# Patient Record
Sex: Female | Born: 1935 | Race: Black or African American | Hispanic: No | Marital: Single | Smoking: Former smoker
Health system: Southern US, Community
[De-identification: ages and names within clinical notes are randomized; demographics above are authoritative.]

## PROBLEM LIST (undated history)

## (undated) DIAGNOSIS — E039 Hypothyroidism, unspecified: Secondary | ICD-10-CM

## (undated) DIAGNOSIS — J189 Pneumonia, unspecified organism: Secondary | ICD-10-CM

## (undated) DIAGNOSIS — IMO0001 Reserved for inherently not codable concepts without codable children: Secondary | ICD-10-CM

## (undated) DIAGNOSIS — M5136 Other intervertebral disc degeneration, lumbar region: Secondary | ICD-10-CM

## (undated) DIAGNOSIS — I509 Heart failure, unspecified: Secondary | ICD-10-CM

## (undated) DIAGNOSIS — R112 Nausea with vomiting, unspecified: Secondary | ICD-10-CM

## (undated) DIAGNOSIS — I499 Cardiac arrhythmia, unspecified: Secondary | ICD-10-CM

## (undated) DIAGNOSIS — D649 Anemia, unspecified: Secondary | ICD-10-CM

## (undated) DIAGNOSIS — I10 Essential (primary) hypertension: Secondary | ICD-10-CM

## (undated) DIAGNOSIS — I639 Cerebral infarction, unspecified: Secondary | ICD-10-CM

## (undated) DIAGNOSIS — Z9889 Other specified postprocedural states: Secondary | ICD-10-CM

## (undated) DIAGNOSIS — E7800 Pure hypercholesterolemia, unspecified: Secondary | ICD-10-CM

## (undated) DIAGNOSIS — M51369 Other intervertebral disc degeneration, lumbar region without mention of lumbar back pain or lower extremity pain: Secondary | ICD-10-CM

## (undated) DIAGNOSIS — J449 Chronic obstructive pulmonary disease, unspecified: Secondary | ICD-10-CM

## (undated) DIAGNOSIS — K219 Gastro-esophageal reflux disease without esophagitis: Secondary | ICD-10-CM

## (undated) DIAGNOSIS — R739 Hyperglycemia, unspecified: Secondary | ICD-10-CM

## (undated) DIAGNOSIS — C801 Malignant (primary) neoplasm, unspecified: Secondary | ICD-10-CM

## (undated) HISTORY — PX: EYE SURGERY: SHX253

## (undated) HISTORY — PX: CARDIAC CATHETERIZATION: SHX172

## (undated) HISTORY — PX: STOMACH SURGERY: SHX791

## (undated) HISTORY — DX: Anemia, unspecified: D64.9

## (undated) HISTORY — PX: ABDOMINAL HYSTERECTOMY: SHX81

## (undated) HISTORY — DX: Chronic obstructive pulmonary disease, unspecified: J44.9

---
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
## 1981-12-04 HISTORY — PX: THYROIDECTOMY: SHX17

## 2013-12-04 DIAGNOSIS — J189 Pneumonia, unspecified organism: Secondary | ICD-10-CM

## 2013-12-04 HISTORY — DX: Pneumonia, unspecified organism: J18.9

## 2015-05-04 ENCOUNTER — Other Ambulatory Visit: Payer: Self-pay | Admitting: Family Medicine

## 2015-05-04 DIAGNOSIS — Z0000 Encounter for general adult medical examination without abnormal findings: Secondary | ICD-10-CM

## 2015-05-04 DIAGNOSIS — H53133 Sudden visual loss, bilateral: Secondary | ICD-10-CM

## 2015-05-05 ENCOUNTER — Other Ambulatory Visit: Payer: Self-pay

## 2015-05-05 ENCOUNTER — Ambulatory Visit
Admission: RE | Admit: 2015-05-05 | Discharge: 2015-05-05 | Disposition: A | Discharge status: Home / Self Care | Payer: No Typology Code available for payment source | Source: Ambulatory Visit | Attending: Family Medicine | Admitting: Family Medicine

## 2015-05-05 DIAGNOSIS — H53133 Sudden visual loss, bilateral: Secondary | ICD-10-CM

## 2015-05-05 DIAGNOSIS — Z0000 Encounter for general adult medical examination without abnormal findings: Secondary | ICD-10-CM

## 2015-05-06 ENCOUNTER — Other Ambulatory Visit: Payer: Self-pay

## 2015-05-07 ENCOUNTER — Ambulatory Visit (HOSPITAL_COMMUNITY)
Admission: RE | Admit: 2015-05-07 | Discharge: 2015-05-07 | Disposition: A | Discharge status: Home / Self Care | Payer: Medicare (Managed Care) | Source: Ambulatory Visit | Attending: Internal Medicine | Admitting: Internal Medicine

## 2015-05-07 ENCOUNTER — Encounter (HOSPITAL_COMMUNITY)
Admission: RE | Disposition: A | Discharge status: Home / Self Care | Payer: Medicaid Other | Source: Ambulatory Visit | Attending: Internal Medicine

## 2015-05-07 ENCOUNTER — Other Ambulatory Visit: Payer: Self-pay | Admitting: Internal Medicine

## 2015-05-07 DIAGNOSIS — Z5309 Procedure and treatment not carried out because of other contraindication: Secondary | ICD-10-CM | POA: Diagnosis not present

## 2015-05-07 DIAGNOSIS — G453 Amaurosis fugax: Secondary | ICD-10-CM

## 2015-05-07 SURGERY — CANCELLED PROCEDURE

## 2015-05-07 NOTE — Progress Notes (Signed)
Pt's TEE not performed today after Dr. Harrington Challenger consulted with pt's eye physician and medical physician. Pt aware of cancellation and aware that she has a new appointment for an Echocardiogram on 6/13 at 1300. PACE aware of appointment as well and aware that appointment today was cancelled. Linward Foster

## 2015-05-07 NOTE — H&P (Signed)
  Patient referred by Dr Bradd Burner for TEE  Episode of amaurosis fugax Was not certain of details  I have contacted [facility]  And spoke with Dr Omelia Blackwater He relays history that she had a short episode of visual loss in 1 eye  Transient   He would recomm an transthoracic echo to rule out myxoma.    Will cancel TEE  Work to shed TTE.  Dorris Carnes

## 2015-05-17 ENCOUNTER — Encounter (HOSPITAL_COMMUNITY): Admit: 2015-05-17 | Payer: Medicaid Other

## 2015-05-17 ENCOUNTER — Other Ambulatory Visit: Payer: Self-pay | Admitting: Internal Medicine

## 2015-05-17 DIAGNOSIS — I509 Heart failure, unspecified: Secondary | ICD-10-CM

## 2015-05-18 ENCOUNTER — Telehealth: Payer: Self-pay

## 2015-05-18 NOTE — Telephone Encounter (Signed)
   This pt should be sched for Transthoracic echo. Not sure if done    Patient was scheduled for an echo at the hospital on 6/13 and was never arrived for the test. Attempted to call patient no answer. .  Not able to leave a message. Will try again tomorrow.

## 2015-05-19 NOTE — Telephone Encounter (Signed)
Attempted to call patient; no answer.  Closing encounter

## 2015-05-25 ENCOUNTER — Telehealth: Payer: Self-pay

## 2015-05-25 NOTE — Telephone Encounter (Signed)
Patient is refusing to have an echo. Tried to talk to patient about the echo. Patient kept repeating "not again". Will forward to Dr. Harrington Challenger, so she is aware.

## 2015-05-25 NOTE — Telephone Encounter (Signed)
-----   Message from Fay Records, MD sent at 05/25/2015  2:43 PM EDT ----- Pt needs to be set up for echo ----- Message -----    From: Jackolyn Confer, MD    Sent: 05/14/2015   9:54 AM      To: Fay Records, MD  Lindsay Park,  Lindsay Park was sent to me for consideration of a temporal artery biopsy to evaluate for temporal arteritis.  I am waiting for her cardiac workup to be complete before scheduling the surgery.  Please let me know what you find on the ECHO.  Given her normal sedimentation rate and one episode of visual change without significant headache, the chances of her having temporal arteritis are low.  Thanks and hope you and your family are doing well.  Jackolyn Confer

## 2015-06-10 ENCOUNTER — Other Ambulatory Visit: Payer: Self-pay

## 2015-06-10 ENCOUNTER — Emergency Department (HOSPITAL_COMMUNITY): Payer: Medicare (Managed Care)

## 2015-06-10 ENCOUNTER — Encounter (HOSPITAL_COMMUNITY): Payer: Self-pay | Admitting: *Deleted

## 2015-06-10 ENCOUNTER — Emergency Department (HOSPITAL_COMMUNITY)
Admission: EM | Admit: 2015-06-10 | Discharge: 2015-06-10 | Disposition: A | Discharge status: Home / Self Care | Payer: Medicare (Managed Care) | Attending: Emergency Medicine | Admitting: Emergency Medicine

## 2015-06-10 ENCOUNTER — Ambulatory Visit (HOSPITAL_COMMUNITY)
Admission: RE | Admit: 2015-06-10 | Discharge: 2015-06-10 | Disposition: A | Discharge status: Home / Self Care | Payer: Medicare (Managed Care) | Source: Ambulatory Visit | Attending: Internal Medicine | Admitting: Internal Medicine

## 2015-06-10 DIAGNOSIS — R52 Pain, unspecified: Secondary | ICD-10-CM

## 2015-06-10 DIAGNOSIS — I7123 Aneurysm of the descending thoracic aorta, without rupture: Secondary | ICD-10-CM

## 2015-06-10 DIAGNOSIS — I313 Pericardial effusion (noninflammatory): Secondary | ICD-10-CM | POA: Diagnosis not present

## 2015-06-10 DIAGNOSIS — I700 Atherosclerosis of aorta: Secondary | ICD-10-CM | POA: Diagnosis not present

## 2015-06-10 DIAGNOSIS — I240 Acute coronary thrombosis not resulting in myocardial infarction: Secondary | ICD-10-CM | POA: Insufficient documentation

## 2015-06-10 DIAGNOSIS — Z859 Personal history of malignant neoplasm, unspecified: Secondary | ICD-10-CM | POA: Diagnosis not present

## 2015-06-10 DIAGNOSIS — I351 Nonrheumatic aortic (valve) insufficiency: Secondary | ICD-10-CM | POA: Diagnosis not present

## 2015-06-10 DIAGNOSIS — I714 Abdominal aortic aneurysm, without rupture: Secondary | ICD-10-CM | POA: Insufficient documentation

## 2015-06-10 DIAGNOSIS — Z8639 Personal history of other endocrine, nutritional and metabolic disease: Secondary | ICD-10-CM | POA: Insufficient documentation

## 2015-06-10 DIAGNOSIS — I10 Essential (primary) hypertension: Secondary | ICD-10-CM | POA: Diagnosis not present

## 2015-06-10 DIAGNOSIS — I509 Heart failure, unspecified: Secondary | ICD-10-CM | POA: Insufficient documentation

## 2015-06-10 DIAGNOSIS — I712 Thoracic aortic aneurysm, without rupture: Secondary | ICD-10-CM | POA: Diagnosis present

## 2015-06-10 DIAGNOSIS — Z8739 Personal history of other diseases of the musculoskeletal system and connective tissue: Secondary | ICD-10-CM | POA: Diagnosis not present

## 2015-06-10 HISTORY — DX: Malignant (primary) neoplasm, unspecified: C80.1

## 2015-06-10 HISTORY — DX: Heart failure, unspecified: I50.9

## 2015-06-10 HISTORY — DX: Other intervertebral disc degeneration, lumbar region without mention of lumbar back pain or lower extremity pain: M51.369

## 2015-06-10 HISTORY — DX: Pure hypercholesterolemia, unspecified: E78.00

## 2015-06-10 HISTORY — DX: Other intervertebral disc degeneration, lumbar region: M51.36

## 2015-06-10 HISTORY — DX: Essential (primary) hypertension: I10

## 2015-06-10 LAB — PROTIME-INR
INR: 1.17 (ref 0.00–1.49)
PROTHROMBIN TIME: 15.1 s (ref 11.6–15.2)

## 2015-06-10 LAB — CBC WITH DIFFERENTIAL/PLATELET
BASOS PCT: 0 % (ref 0–1)
Basophils Absolute: 0 10*3/uL (ref 0.0–0.1)
EOS PCT: 1 % (ref 0–5)
Eosinophils Absolute: 0.1 10*3/uL (ref 0.0–0.7)
HCT: 37.6 % (ref 36.0–46.0)
Hemoglobin: 12.5 g/dL (ref 12.0–15.0)
LYMPHS PCT: 12 % (ref 12–46)
Lymphs Abs: 1 10*3/uL (ref 0.7–4.0)
MCH: 26.8 pg (ref 26.0–34.0)
MCHC: 33.2 g/dL (ref 30.0–36.0)
MCV: 80.7 fL (ref 78.0–100.0)
Monocytes Absolute: 1.1 10*3/uL — ABNORMAL HIGH (ref 0.1–1.0)
Monocytes Relative: 13 % — ABNORMAL HIGH (ref 3–12)
Neutro Abs: 6.2 10*3/uL (ref 1.7–7.7)
Neutrophils Relative %: 74 % (ref 43–77)
PLATELETS: 449 10*3/uL — AB (ref 150–400)
RBC: 4.66 MIL/uL (ref 3.87–5.11)
RDW: 17.4 % — ABNORMAL HIGH (ref 11.5–15.5)
WBC: 8.4 10*3/uL (ref 4.0–10.5)

## 2015-06-10 LAB — BASIC METABOLIC PANEL
ANION GAP: 10 (ref 5–15)
BUN: 11 mg/dL (ref 6–20)
CO2: 28 mmol/L (ref 22–32)
Calcium: 8.8 mg/dL — ABNORMAL LOW (ref 8.9–10.3)
Chloride: 100 mmol/L — ABNORMAL LOW (ref 101–111)
Creatinine, Ser: 1.11 mg/dL — ABNORMAL HIGH (ref 0.44–1.00)
GFR calc non Af Amer: 44 mL/min — ABNORMAL LOW (ref >60)
GFR, EST AFRICAN AMERICAN: 51 mL/min — AB (ref >60)
GLUCOSE: 108 mg/dL — AB (ref 65–99)
Potassium: 3.4 mmol/L — ABNORMAL LOW (ref 3.5–5.1)
Sodium: 138 mmol/L (ref 135–145)

## 2015-06-10 LAB — I-STAT TROPONIN, ED: TROPONIN I, POC: 0 ng/mL (ref 0.00–0.08)

## 2015-06-10 LAB — TYPE AND SCREEN
ABO/RH(D): O POS
ANTIBODY SCREEN: NEGATIVE

## 2015-06-10 LAB — ABO/RH: ABO/RH(D): O POS

## 2015-06-10 LAB — APTT: aPTT: 40 seconds — ABNORMAL HIGH (ref 24–37)

## 2015-06-10 MED ORDER — IOHEXOL 350 MG/ML SOLN
100.0000 mL | Freq: Once | INTRAVENOUS | Status: AC | PRN
  Administered 2015-06-10: 100 mL via INTRAVENOUS

## 2015-06-10 NOTE — ED Provider Notes (Signed)
Patient was sent to the emergency room for evaluation after being told by her cardiologist that she did have a CT scan is done an echocardiogram performed today. When we first evaluated the patient, there was no note in the electronic record system indicating why she was here. We're able to contact the cardiology service and they reported the patient's echocardiogram was concerning for the possibility of dissection Physical Exam  BP 131/80 mmHg  Pulse 78  Temp(Src) 97.9 F (36.6 C) (Oral)  Resp 15  Ht 5' (1.524 m)  Wt 92 lb (41.731 kg)  BMI 17.97 kg/m2  SpO2 99%  Physical Exam  Constitutional: She appears well-developed and well-nourished. No distress.  HENT:  Head: Normocephalic and atraumatic.  Right Ear: External ear normal.  Left Ear: External ear normal.  Eyes: Conjunctivae are normal. Right eye exhibits no discharge. Left eye exhibits no discharge. No scleral icterus.  Neck: Neck supple. No tracheal deviation present.  Cardiovascular: Normal rate.   Pulmonary/Chest: Effort normal. No stridor. No respiratory distress.  Musculoskeletal: She exhibits no edema.  Neurological: She is alert. Cranial nerve deficit: no gross deficits.  Skin: Skin is warm and dry. No rash noted.  Psychiatric: She has a normal mood and affect.  Nursing note and vitals reviewed.   ED Course  Procedures  MDM Pt denies any complaints in the ED.  The CT scan did not show evidence of dissection.  Aneurysm findings were discussed with Dr Bridgett Larsson.   Outpatient follow up recommended.  No need for emergent treatment.      Dorie Rank, MD 06/11/15 [phone]

## 2015-06-10 NOTE — ED Notes (Signed)
Pt had an echocardiogram done today and was told to bring to ED by Dr. Aundra Dubin for a CT scan. Family not sure why.

## 2015-06-10 NOTE — ED Notes (Signed)
Pt reports having ECHO done today and was told by MD McClean to come to ED for CT scan for further evaluation. Pt reports left sided chest pain that travels through to back x "years"; pt in NAD at this time.  Pt has no complaints at this time, only sob on exertion.

## 2015-06-10 NOTE — ED Provider Notes (Signed)
CSN: [id number]     Arrival date & time 06/10/15  1052 History   First MD Initiated Contact with Patient 06/10/15 1144     Chief Complaint  Patient presents with  . CT      (Consider location/radiation/quality/duration/timing/severity/associated sxs/prior Treatment) The history is provided by the patient and a relative. No language interpreter was used.  Lindsay Park is a 81 year old female with a history of hypertension, hyperlipidemia, thyroid disease, and CHF who presents after having a routine outpatient ECHO just prior to arrival and was told by her cardiologist, Dr. Aundra Dubin, to come to the ED. She states she is unaware of why she had to come but has no complaints. She denies any recent illness, fever, chills , dizziness, headache, chest pain, shortness of breath, cough , abdominal pain, nausea, vomiting, diarrhea , leg swelling.  Past Medical History  Diagnosis Date  . Hypertension   . Hypercholesteremia   . Thyroid disease   . CHF (congestive heart failure)   . Degenerative disc disease, lumbar   . Cancer    Past Surgical History  Procedure Laterality Date  . Abdominal hysterectomy    . Thyroidectomy     No family history on file. History  Substance Use Topics  . Smoking status: Never Smoker   . Smokeless tobacco: Not on file  . Alcohol Use: No   OB History    No data available     Review of Systems  Constitutional: Negative for fever.  Respiratory: Negative for chest tightness and shortness of breath.   Cardiovascular: Negative for chest pain, palpitations and leg swelling.  Gastrointestinal: Negative for abdominal pain.  Musculoskeletal: Negative for back pain.  Neurological: Negative for dizziness, syncope, weakness and light-headedness.  All other systems reviewed and are negative.     Allergies  Review of patient's allergies indicates no known allergies.  Home Medications   Prior to Admission medications   Not on File   BP 134/93 mmHg  Pulse 81   Temp(Src) 97.9 F (36.6 C) (Oral)  Resp 18  Ht 5' (1.524 m)  Wt 92 lb (41.731 kg)  BMI 17.97 kg/m2  SpO2 97% Physical Exam  Constitutional: She is oriented to person, place, and time. She appears well-developed and well-nourished.  HENT:  Head: Normocephalic and atraumatic.  Eyes: Conjunctivae are normal.  Neck: Normal range of motion. Neck supple.  Cardiovascular: Normal rate, regular rhythm and normal heart sounds.   Pulmonary/Chest: Effort normal and breath sounds normal. No respiratory distress. She has no wheezes. She has no rales.  Abdominal: Soft. Normal appearance. She exhibits no distension. There is no tenderness. There is no rebound and no guarding.  Musculoskeletal: Normal range of motion.  Neurological: She is alert and oriented to person, place, and time. She has normal strength. No sensory deficit. GCS eye subscore is 4. GCS verbal subscore is 5. GCS motor subscore is 6.  Skin: Skin is warm and dry.  Nursing note and vitals reviewed.   ED Course  Procedures (including critical care time) Labs Review Labs Reviewed  CBC WITH DIFFERENTIAL/PLATELET - Abnormal; Notable for the following:    RDW 17.4 (*)    Platelets 449 (*)    Monocytes Relative 13 (*)    Monocytes Absolute 1.1 (*)    All other components within normal limits  BASIC METABOLIC PANEL - Abnormal; Notable for the following:    Potassium 3.4 (*)    Chloride 100 (*)    Glucose, Bld 108 (*)  Creatinine, Ser 1.11 (*)    Calcium 8.8 (*)    GFR calc non Af Amer 44 (*)    GFR calc Af Amer 51 (*)    All other components within normal limits  APTT - Abnormal; Notable for the following:    aPTT 40 (*)    All other components within normal limits  PROTIME-INR  I-STAT TROPOININ, ED  TYPE AND SCREEN  ABO/RH    Imaging Review Ct Angio Chest Aorta W/cm &/or Wo/cm  06/10/2015   CLINICAL DATA:  Left chest pain radiating to back for years. Shortness breath on exertion.  EXAM: CT ANGIOGRAPHY CHEST, ABDOMEN AND  PELVIS  TECHNIQUE: Multidetector CT imaging through the chest, abdomen and pelvis was performed using the standard protocol during bolus administration of intravenous contrast. Multiplanar reconstructed images and MIPs were obtained and reviewed to evaluate the vascular anatomy.  CONTRAST:  136mL OMNIPAQUE IOHEXOL 350 MG/ML SOLN  COMPARISON:  None.  FINDINGS: CTA CHEST  The noncontrast scan shows a thoracic aortic aneurysm. No pleural or pericardial effusion. Gas and fluid distend the mid esophagus. No mediastinal hematoma.  CTA via right arm injection. The SVC is patent. There is marked right atrial enlargement. The right ventricle is nondilated. Satisfactory opacification of pulmonary arteries noted, and there is no evidence of pulmonary emboli. Patent superior and inferior pulmonary veins bilaterally.  There is good contrast opacification of the aorta. The ascending thoracic aorta is unremarkable. There is bovine variant brachiocephalic arterial origin anatomy without proximal stenosis. Calcified plaque in the distal arch. There is a large eccentric fusiform aneurysm of the descending thoracic aorta measuring 5.3 cm maximum transverse diameter in the proximal segment, 4.5 cm at the level of the carina, 3.5 cm distal descending, 4.4 cm at the level of the diaphragm. There is eccentric mural thrombus in the proximal aneurysmal segment. No evidence of dissection or aortic stenosis.  No hilar or mediastinal adenopathy. Dependent atelectasis posteriorly in both lungs.  Review of the MIP images confirms the above findings.  CTA ABDOMEN AND PELVIS  Arterial findings:  Aorta: Continuation of fusiform aneurysmal dilatation, 4.4 cm in the supraceliac segment, 2.8 cm juxtarenal, tapering to 17 mm just above the bifurcation. No dissection or stenosis. There is some eccentric nonocclusive mural thrombus in the supraceliac and suprarenal segments.  Celiac axis: Diminutive at its origin, with a focal moderately severe stenosis  at the level of the median arcuate ligament of the diaphragm, ectatic distally.  Superior mesenteric: Widely patent. The common hepatic artery originates from the SMA, an anatomic variant. Distal branching otherwise unremarkable.  Left renal:          Single, patent  Right renal:         Single, patent  Inferior mesenteric: Patent  Left iliac: Ectatic common iliac measure in 15 mm diameter. Internal and external branches are mildly ectatic with some scattered nonocclusive plaque. No dissection.  Right iliac: Ectatic common iliac, 15 mm diameter. Mildly tortuous and ectatic internal and external iliac branches without dissection or stenosis.  Venous findings:     Dedicated venous phase imaging not obtained.  Review of the MIP images confirms the above findings.  Nonvascular findings: Unremarkable arterial phase evaluation of liver, spleen, adrenal glands, kidneys, pancreas, gallbladder. Stomach, small bowel and colon are nondilated. Scattered descending diverticula. Urinary bladder physiologically distended. No free air. No ascites. No adenopathy. Spondylitic changes at the thoracolumbar junction and in the lower lumbar spine. Facet DJD L4-5 and L5-S1, probably accounting for the  grade 1 anterolisthesis L4-5.  IMPRESSION: 1. Fusiform descending thoracic aortic aneurysm extending to the juxtarenal abdominal aorta, as detailed above, 5.3 cm maximum transverse diameter. 2. Negative for acute  PE or aortic Dissection. 3. 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   Electronically Signed   By: Lucrezia Europe M.D.   On: 06/10/2015 14:11   Ct Angio Abd/pel W/ And/or W/o  06/10/2015   CLINICAL DATA:  Left chest pain radiating to back for years. Shortness breath on exertion.  EXAM: CT ANGIOGRAPHY CHEST, ABDOMEN AND PELVIS  TECHNIQUE: Multidetector CT imaging through the chest, abdomen and pelvis was performed using the standard protocol during bolus administration of intravenous  contrast. Multiplanar reconstructed images and MIPs were obtained and reviewed to evaluate the vascular anatomy.  CONTRAST:  156mL OMNIPAQUE IOHEXOL 350 MG/ML SOLN  COMPARISON:  None.  FINDINGS: CTA CHEST  The noncontrast scan shows a thoracic aortic aneurysm. No pleural or pericardial effusion. Gas and fluid distend the mid esophagus. No mediastinal hematoma.  CTA via right arm injection. The SVC is patent. There is marked right atrial enlargement. The right ventricle is nondilated. Satisfactory opacification of pulmonary arteries noted, and there is no evidence of pulmonary emboli. Patent superior and inferior pulmonary veins bilaterally.  There is good contrast opacification of the aorta. The ascending thoracic aorta is unremarkable. There is bovine variant brachiocephalic arterial origin anatomy without proximal stenosis. Calcified plaque in the distal arch. There is a large eccentric fusiform aneurysm of the descending thoracic aorta measuring 5.3 cm maximum transverse diameter in the proximal segment, 4.5 cm at the level of the carina, 3.5 cm distal descending, 4.4 cm at the level of the diaphragm. There is eccentric mural thrombus in the proximal aneurysmal segment. No evidence of dissection or aortic stenosis.  No hilar or mediastinal adenopathy. Dependent atelectasis posteriorly in both lungs.  Review of the MIP images confirms the above findings.  CTA ABDOMEN AND PELVIS  Arterial findings:  Aorta: Continuation of fusiform aneurysmal dilatation, 4.4 cm in the supraceliac segment, 2.8 cm juxtarenal, tapering to 17 mm just above the bifurcation. No dissection or stenosis. There is some eccentric nonocclusive mural thrombus in the supraceliac and suprarenal segments.  Celiac axis: Diminutive at its origin, with a focal moderately severe stenosis at the level of the median arcuate ligament of the diaphragm, ectatic distally.  Superior mesenteric: Widely patent. The common hepatic artery originates from the SMA,  an anatomic variant. Distal branching otherwise unremarkable.  Left renal:          Single, patent  Right renal:         Single, patent  Inferior mesenteric: Patent  Left iliac: Ectatic common iliac measure in 15 mm diameter. Internal and external branches are mildly ectatic with some scattered nonocclusive plaque. No dissection.  Right iliac: Ectatic common iliac, 15 mm diameter. Mildly tortuous and ectatic internal and external iliac branches without dissection or stenosis.  Venous findings:     Dedicated venous phase imaging not obtained.  Review of the MIP images confirms the above findings.  Nonvascular findings: Unremarkable arterial phase evaluation of liver, spleen, adrenal glands, kidneys, pancreas, gallbladder. Stomach, small bowel and colon are nondilated. Scattered descending diverticula. Urinary bladder physiologically distended. No free air. No ascites. No adenopathy. Spondylitic changes at the thoracolumbar junction and in the lower lumbar spine. Facet DJD L4-5 and L5-S1, probably accounting for the grade 1 anterolisthesis L4-5.  IMPRESSION: 1. Fusiform descending thoracic aortic aneurysm extending  to the juxtarenal abdominal aorta, as detailed above, 5.3 cm maximum transverse diameter. 2. Negative for acute  PE or aortic Dissection. 3. 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   Electronically Signed   By: Lucrezia Europe M.D.   On: 06/10/2015 14:11     EKG Interpretation None      MDM   Final diagnoses:  Descending thoracic aortic aneurysm   I spoke to Dr. Aundra Dubin who stated that the patient had a possible descending aortic dissection which was the reason for sending her to the ED.  Echo results have not come through in epic yet since she had it done this morning at 9:30 AM. I spoke to Dr. Hillard Danker regarding this patient who has seen and evaluated the patient himself. Right Arm: 127/80  Left Arm: 137/91   Patient's vitals are stable. Chest CT  angiogram shows descending thoracic aortic aneurysm measuring 5.3 cm.  No PE or aortic dissection. Patient has no complaints of shortness of breath or chest pain. I spoke to Dr. Bridgett Larsson, Vascular surgeon, regarding the patient's aortic aneurysm being 5.3 cm. He stated that he would see her in the office. I discussed the results of lab work and CT of the chest and abdomen pelvis with patient and daughter. They agree to call the office and make an appointment.  I discussed strict return precautions such as chest pain, shortness of breath, or light headedness.     Ottie Glazier, PA-C 06/10/15 1604  Dorie Rank, MD 06/11/15 [phone]

## 2015-06-10 NOTE — Progress Notes (Signed)
  Echocardiogram 2D Echocardiogram has been performed.  Lindsay Park 06/10/2015, 12:15 PM

## 2015-06-10 NOTE — Discharge Instructions (Signed)
Thoracic Aortic Aneurysm   Return for shortness of breath or chest pain. An aneurysm is a bulge in an artery. It happens when the wall of the artery is weakened or damaged. If the aneurysm gets too big, it bursts (ruptures) and severe bleeding occurs. A thoracic aortic aneurysm is an aneurysm that occurs in the first part of the aorta, between the heart and the diaphragm. The aorta is the main artery and supplies blood from the heart to the rest of the body. A thoracic aortic aneurysm can enlarge and rupture or blood can flow between the layers of the wall of the aorta through a tear (aorticdissection). Both of these conditions can cause bleeding inside the body and can be life threatening unless diagnosed and treated promptly. CAUSES  The exact cause of a thoracic aortic aneurysm is often unknown. Some contributing factors are:   A hardening of the arteries caused by the buildup of fat and other substances in the lining of a blood vessel (arteriosclerosis).  Inflammation of the walls of an artery (arteritis).  Connective tissue diseases, such as Marfan syndrome.  Injury or trauma to the aorta.  An infection, such as syphilis or staphylococcus, in the wall of the aorta (infectious aortitis) caused by bacteria. RISK FACTORS  Risk factors that contribute to a thoracic aortic aneurysm may include:  Age older than 45 years.  High blood pressure (hypertension).  Female gender.  Ethnicity (white race).  Obesity.  Family history of aneurysm (first degree relatives only).  Tobacco use. PREVENTION  The following healthy lifestyle habits may help decrease your risk of a thoracic aortic aneurysm:  Quitting smoking. Smoking can raise your blood pressure and cause arteriosclerosis.  Limiting or avoiding alcohol.  Keeping your blood pressure, blood sugar level, and cholesterol levels within normal limits.  Decreasing your salt intake. In some people, too much salt can raise blood pressure  and increase your risk of abdominal aortic aneurysm.  Eating a diet low in saturated fats and cholesterol.  Increasing your fiber intake by including whole grains, vegetables, and fruits in your diet. Eating these foods may help lower blood pressure.  Maintaining a healthy weight.  Staying physically active and exercising regularly. SYMPTOMS  The symptoms of thoracic aortic aneurysm may vary depending on the size and rate of growth of the aneurysm. Most grow slowly and do not have any symptoms. When symptoms do occur, they may include:  Pain (chest, back, sides, or abdomen). The pain may vary in intensity. A sudden onset of severe pain may indicate that the aneurysm has ruptured.  Hoarseness.  Cough.  Shortness of breath.  Swallowing problems.  Nausea or vomiting or both. DIAGNOSIS  Since most unruptured thoracic aortic aneurysms have no symptoms, they are often discovered during diagnostic exams for other conditions. An aneurysm may be found during the following procedures:  Ultrasonography (a one-time screening for thoracic aortic aneurysm by ultrasonography is also recommended for all men aged 78-75 years who have ever smoked).  X-ray exams.  A CT scan.  An MRI.  Angiography or arteriography. TREATMENT  Treatment of a thoracic aortic aneurysm depends on the size of your aneurysm, your age, and risk factors for rupture. Medicine to control blood pressure and pain may be used to manage aneurysms smaller than 2.3 in (6 cm). Regular monitoring for enlargement may be recommended by your health care provider if:  The aneurysm is 1.2-1.5 in (3-4 cm) in size (an annual ultrasonography may be recommended).  The aneurysm is  1.5-1.8 in (4-4.5 cm) in size (an ultrasonography every 6 months may be recommended).  The aneurysm is larger than 1.8 in (4.5 cm) in size (your health care provider may ask that you be examined by a vascular surgeon). If your aneurysm is larger than 2.2 in  (5.5 cm) or if it is enlarging quickly, surgical repair may be recommended. There are two main methods for repair of an aneurysm:   Endovascular repair (a minimally invasive surgery).  Open repair. This method is used if an endovascular repair is not possible. Document Released: 11/20/2005 Document Revised: 09/10/2013 Document Reviewed: 06/02/2013 [facility] Patient Information 2015 [place], [place]. This information is not intended to replace advice given to you by your health care provider. Make sure you discuss any questions you have with your health care provider.

## 2015-06-10 NOTE — ED Notes (Signed)
Per MD obtain BP in both left and right arm:  Right Arm: 127/80 Left Arm: 137/91

## 2015-06-10 NOTE — ED Notes (Signed)
Patient transported to CT 

## 2015-06-10 NOTE — ED Notes (Signed)
Pt had a cardiovascular procedure today.  When the results were read by Dr Marigene Ehlers, he told the pt to come to the ED for a CT.  Pt denies chest pain, but states dyspnea on exertion.

## 2015-06-11 ENCOUNTER — Encounter: Payer: Medicare (Managed Care) | Admitting: Vascular Surgery

## 2015-06-11 ENCOUNTER — Encounter: Payer: Self-pay | Admitting: Vascular Surgery

## 2015-06-17 ENCOUNTER — Encounter: Payer: Self-pay | Admitting: Vascular Surgery

## 2015-06-18 ENCOUNTER — Ambulatory Visit (INDEPENDENT_AMBULATORY_CARE_PROVIDER_SITE_OTHER): Payer: Medicare (Managed Care) | Admitting: Vascular Surgery

## 2015-06-18 ENCOUNTER — Encounter: Payer: Self-pay | Admitting: Vascular Surgery

## 2015-06-18 VITALS — BP 134/88 | HR 91 | Ht 60.0 in | Wt 91.8 lb

## 2015-06-18 DIAGNOSIS — I716 Thoracoabdominal aortic aneurysm, without rupture, unspecified: Secondary | ICD-10-CM | POA: Insufficient documentation

## 2015-06-18 DIAGNOSIS — Q254 Congenital malformation of aorta unspecified: Secondary | ICD-10-CM

## 2015-06-18 DIAGNOSIS — Z01818 Encounter for other preprocedural examination: Secondary | ICD-10-CM | POA: Diagnosis not present

## 2015-06-18 NOTE — Progress Notes (Signed)
Referred by: [facility] ED  Reason for referral: thoracic aneurysm  History of Present Illness  The patient is a 81 y.o. (1929/05/08) female who presents with chief complaint: chest pain.  Patient has  2-3 year history of intermittent chest pain.  The patient was at [facility] getting a echocardiogram.  They thought they saw an aortic dissection and sent the patient to the ED for CTA.  At the ED, she was diagnosed with a thoracic aneurysm.  She was not sx at that time so she was sent to clinic for evaluation.  The patient intermittent chest pain radiating to back.  The patient's risk factors for TAAA included: none.  The patient smoked > 45 years ago.   Past Medical History  Diagnosis Date  . Hypertension   . Hypercholesteremia   . Thyroid disease   . CHF (congestive heart failure)   . Degenerative disc disease, lumbar   . Cancer   . Anemia   . COPD (chronic obstructive pulmonary disease)     Past Surgical History  Procedure Laterality Date  . Abdominal hysterectomy    . Thyroidectomy    . Stomach surgery      History   Social History  . Marital Status: Single    Spouse Name: N/A  . Number of Children: N/A  . Years of Education: N/A   Occupational History  . Not on file.   Social History Main Topics  . Smoking status: Never Smoker   . Smokeless tobacco: Not on file  . Alcohol Use: No  . Drug Use: No  . Sexual Activity: Not on file   Other Topics Concern  . Not on file   Social History Narrative    Family History  Problem Relation Age of Onset  . Heart disease Sister     before age 25  . Deep vein thrombosis Daughter   . COPD Daughter   . Hyperlipidemia Daughter   . Hypertension Daughter   . COPD Son   . Hyperlipidemia Son   . Hypertension Son     Current Outpatient Prescriptions  Medication Sig Dispense Refill  . aspirin 81 MG tablet Take 81 mg by mouth daily.    Marland Kitchen atorvastatin (LIPITOR) 20 MG tablet Take 20 mg by mouth daily.    . Cholecalciferol  (VITAMIN D3) 50000 UNITS CAPS Take 1 capsule by mouth once a week.    . famotidine (PEPCID) 20 MG tablet Take 20 mg by mouth 2 (two) times daily.    Marland Kitchen levothyroxine (SYNTHROID, LEVOTHROID) 100 MCG tablet Take 100 mcg by mouth daily before breakfast.    . losartan (COZAAR) 25 MG tablet Take 25 mg by mouth daily.    . metoprolol succinate (TOPROL-XL) 50 MG 24 hr tablet Take 50 mg by mouth daily. Take with or immediately following a meal.    . traMADol (ULTRAM) 50 MG tablet Take by mouth every 12 (twelve) hours as needed.    . predniSONE (DELTASONE) 20 MG tablet      No current facility-administered medications for this visit.     No Known Allergies    REVIEW OF SYSTEMS:  (Positives checked otherwise negative)  CARDIOVASCULAR:   [x]  chest pain,  [x]  chest pressure,  [x]  palpitations,  [x]  shortness of breath when laying flat,  [x]  shortness of breath with exertion,   [ ]  pain in feet when walking,  [ ]  pain in feet when laying flat, [ ]  history of blood clot in veins (DVT),  [ ]   history of phlebitis,  [ ]  swelling in legs,  [ ]  varicose veins  PULMONARY:   [ ]  productive cough,  [ ]  asthma,  [ ]  wheezing  NEUROLOGIC:   [x]  weakness in arms or legs,  [x]  numbness in arms or legs,  [ ]  difficulty speaking or slurred speech,  [ ]  temporary loss of vision in one eye,  [ ]  dizziness  HEMATOLOGIC:   [ ]  bleeding problems,  [ ]  problems with blood clotting too easily  MUSCULOSKEL:   [ ]  joint pain, [ ]  joint swelling  GASTROINTEST:   [ ]  vomiting blood,  [ ]  blood in stool     GENITOURINARY:   [ ]  burning with urination,  [ ]  blood in urine  PSYCHIATRIC:   [ ]  history of major depression  INTEGUMENTARY:   [ ]  rashes,  [ ]  ulcers  CONSTITUTIONAL:   [x]  fever,  [ ]  chills   For VQI Use Only   PRE-ADM LIVING: Home  AMB STATUS: Ambulatory  CAD Sx: None  PRIOR CHF: Mild  STRESS TEST: [x]  No, [ ]  Normal, [ ]  + ischemia, [ ]  + MI, [ ]  Both   Physical  Examination  Filed Vitals:   06/18/15 1424 06/18/15 1426  BP: 176/103 134/88  Pulse: 91   Height: 5' (1.524 m)   Weight: 91 lb 12.8 oz (41.64 kg)   SpO2: 98%    Body mass index is 17.93 kg/(m^2).  General: A&O x 3, WD, Cachectic, Ill appear   Head: [place]/AT, Temporalis wasting   Ear/Nose/Throat: Hearing grossly intact, nares w/o erythema or drainage, oropharynx w/o Erythema/Exudate, poor dentition  Eyes: PERRL, EOMI, B arcus senilus  Neck: Supple, no nuchal rigidity, no palpable LAD  Pulmonary: Sym exp, good air movt, CTAB, no rales, rhonchi, & wheezing  Cardiac: RRR, Nl S1, S2, no rubs or gallops, +murmur over apex  Vascular: Vessel Right Left  Radial Palpable Palpable  Brachial Palpable Palpable  Carotid Palpable, without bruit Palpable, without bruit  Aorta Not palpable N/A  Femoral Palpable Palpable  Popliteal Not palpable Not palpable  PT Palpable Palpable  DP Palpable Palpable   Gastrointestinal: soft, NTND, no G/R, no HSM, no masses, no CVAT B  Musculoskeletal: M/S 5/5 throughout , Extremities without ischemic changes   Neurologic: CN 2-12 intact , Pain and light touch intact in extremities , Motor exam as listed above  Psychiatric: Judgment intact, Mood & affect appropriate for pt's clinical situation  Dermatologic: See M/S exam for extremity exam, no rashes otherwise noted  Lymph : No Cervical, Axillary, or Inguinal lymphadenopathy    Non-Invasive Vascular Imaging  Echocardiogram (06/10/15): - Left ventricle: The cavity size was normal. Wall thickness was normal. Doppler parameters are consistent with abnormal left ventricular relaxation (grade 1 diastolic dysfunction). - Aortic valve: There was trivial regurgitation. - Aorta: There is severe atherosclerosis in the aortic arch and descending thoracic aorta. The suprarenal abdominal aorta is mildly aneurysmal (diameter 36 mm), with mural thrombus, and heavy spontaneous echo contrast. No intimal  flap is seen. - Atrial septum: The septum bowed from right to left, consistent with increased right atrial pressure. - Tricuspid valve: Moderate, holosystolicprolapse. There was moderate regurgitation. - Pericardium, extracardiac: A small to moderate pericardial effusion was identified circumferential to the heart.  B carotid duplex (05/05/15): 1. Mild bilateral carotid bifurcation plaque, with less than 50% diameter stenosis. The exam does not exclude plaque ulceration or embolization. Continued surveillance recommended.   Laboratory: CBC:  Component Value Date/Time   WBC 8.4 06/10/2015 1234   RBC 4.66 06/10/2015 1234   HGB 12.5 06/10/2015 1234   HCT 37.6 06/10/2015 1234   PLT 449* 06/10/2015 1234   MCV 80.7 06/10/2015 1234   MCH 26.8 06/10/2015 1234   MCHC 33.2 06/10/2015 1234   RDW 17.4* 06/10/2015 1234   LYMPHSABS 1.0 06/10/2015 1234   MONOABS 1.1* 06/10/2015 1234   EOSABS 0.1 06/10/2015 1234   BASOSABS 0.0 06/10/2015 1234    BMP:    Component Value Date/Time   NA 138 06/10/2015 1234   K 3.4* 06/10/2015 1234   CL 100* 06/10/2015 1234   CO2 28 06/10/2015 1234   GLUCOSE 108* 06/10/2015 1234   BUN 11 06/10/2015 1234   CREATININE 1.11* 06/10/2015 1234   CALCIUM 8.8* 06/10/2015 1234   GFRNONAA 44* 06/10/2015 1234   GFRAA 51* 06/10/2015 1234    Coagulation: Lab Results  Component Value Date   INR 1.17 06/10/2015   No results found for: PTT  Lipids: No results found for: CHOL, TRIG, HDL, CHOLHDL, VLDL, LDLCALC, LDLDIRECT  Radiology:  CTA chest/abd/pelvis (06/10/15) 1. Fusiform descending thoracic aortic aneurysm extending to the juxtarenal abdominal aorta, as detailed above, 5.3 cm maximum transverse diameter. 2. Negative for acute PE or aortic Dissection. 3. 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  Based on my review of this patient's CTA, she has Crawford 1 TAAA.  I think she has a PAU in the  thoracic segment which might account for her sx.     Medical Decision Making  The patient is a 81 y.o. 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  In fact, I have concerns that she might not do well with a endovascular TAAA repair (TEVAR+FEVAR)  Rather, I would favor treating the likely thoracic PAU with thoracic endograft and seeing if this is enough to alleviate her sx.  If she lives long enough, she might need the FEVAR at a later date.  This staged approach has become more common practice in the literature due to the concept of ischemic conditioning of the intercostal arteries.  I will need to do a 3-D reconstruction on the chest CTA to see if there is enough landing zone for the proximal graft.  If not, a L SCA to L CCA transposition or L SCA to L CCA graft maybe needed.   Cardiac evaluation would be essential for risk stratification and optimization.  We discussed proceeding with the cardiac work-up while my [employer] rep was evaluating the CTA for compatibility with his device.  Will plan on her coming back in the next 2-4 weeks after the cardiac evaluation is completed.  I emphasized the importance of maximal medical management including strict control of blood pressure, blood glucose, and lipid levels, antiplatelet agents, obtaining regular exercise, and cessation of smoking.    Thank you for allowing [place] to participate in this patient's care.   Adele Barthel, MD [facility] Office: [phone] Pager: [phone]  06/18/2015, 4:57 PM

## 2015-06-21 ENCOUNTER — Encounter: Payer: Self-pay | Admitting: Cardiology

## 2015-06-21 ENCOUNTER — Encounter: Payer: Self-pay | Admitting: *Deleted

## 2015-06-21 ENCOUNTER — Ambulatory Visit (INDEPENDENT_AMBULATORY_CARE_PROVIDER_SITE_OTHER): Payer: Medicare (Managed Care) | Admitting: Cardiology

## 2015-06-21 VITALS — BP 110/70 | HR 78 | Ht 60.0 in | Wt 91.0 lb

## 2015-06-21 DIAGNOSIS — Z01810 Encounter for preprocedural cardiovascular examination: Secondary | ICD-10-CM

## 2015-06-21 DIAGNOSIS — I5022 Chronic systolic (congestive) heart failure: Secondary | ICD-10-CM

## 2015-06-21 DIAGNOSIS — R0602 Shortness of breath: Secondary | ICD-10-CM

## 2015-06-21 NOTE — Progress Notes (Addendum)
Clinical Summary Lindsay Park is a 81 y.o.female seen today as a new patient for the following medical problems.  1. Preoperative evaluation  - patient is being considered for aortic aneurysm repair - CTA C/A/P with fusiform descending thoracic aortic aneurysm 5.3 cm - echo 06/2015 grade I diastoilc dysfunction, severe atherosclerosis of aortic arch and descending aorta, mural thomrbus in descending aorta, mod TR. LVEF omitted from report but by my review appears to be mildly decreased LVEF 40-45% with septal hypokinesis.    2. Chronic systolic HF - reports diagnosed with CHF 14 years at [facility], reports cath at time - she is on Toprol XL and ARB - recent echo with mild LV systolic dysfunction LVEF 40-45%, septal hypokinesis -  Denies any LE edema, no orthopnea, no PND. Chronic DOE at short distances.   - denies any chest pressure, has had some atypical chest pain over the last few months. Occasional palpitations.  - sedentary lifestyle, SOB with walking up flight of stairs.   3. SOB - smoking x 30 years  Past Medical History  Diagnosis Date  . Hypertension   . Hypercholesteremia   . Thyroid disease   . CHF (congestive heart failure)   . Degenerative disc disease, lumbar   . Cancer   . Anemia   . COPD (chronic obstructive pulmonary disease)      No Known Allergies   Current Outpatient Prescriptions  Medication Sig Dispense Refill  . aspirin 81 MG tablet Take 81 mg by mouth daily.    Marland Kitchen atorvastatin (LIPITOR) 20 MG tablet Take 20 mg by mouth daily.    . Cholecalciferol (VITAMIN D3) 50000 UNITS CAPS Take 1 capsule by mouth once a week.    . famotidine (PEPCID) 20 MG tablet Take 20 mg by mouth 2 (two) times daily.    Marland Kitchen levothyroxine (SYNTHROID, LEVOTHROID) 100 MCG tablet Take 100 mcg by mouth daily before breakfast.    . losartan (COZAAR) 25 MG tablet Take 25 mg by mouth daily.    . metoprolol succinate (TOPROL-XL) 50 MG 24 hr tablet Take 50 mg by  mouth daily. Take with or immediately following a meal.    . predniSONE (DELTASONE) 20 MG tablet     . traMADol (ULTRAM) 50 MG tablet Take by mouth every 12 (twelve) hours as needed.     No current facility-administered medications for this visit.     Past Surgical History  Procedure Laterality Date  . Abdominal hysterectomy    . Thyroidectomy    . Stomach surgery       No Known Allergies    Family History  Problem Relation Age of Onset  . Heart disease Sister     before age 50  . Deep vein thrombosis Daughter   . COPD Daughter   . Hyperlipidemia Daughter   . Hypertension Daughter   . COPD Son   . Hyperlipidemia Son   . Hypertension Son      Social History Lindsay Park reports that she has never smoked. She does not have any smokeless tobacco history on file. Lindsay Park reports that she does not drink alcohol.   Review of Systems CONSTITUTIONAL: No weight loss, fever, chills, weakness or fatigue.  HEENT: Eyes: No visual loss, blurred vision, double vision or yellow sclerae.No hearing loss, sneezing, congestion, runny nose or sore throat.  SKIN: No rash or itching.  CARDIOVASCULAR: per HPI RESPIRATORY: No shortness of breath, cough or sputum.  GASTROINTESTINAL: No anorexia, nausea,  vomiting or diarrhea. No abdominal pain or blood.  GENITOURINARY: No burning on urination, no polyuria NEUROLOGICAL: No headache, dizziness, syncope, paralysis, ataxia, numbness or tingling in the extremities. No change in bowel or bladder control.  MUSCULOSKELETAL: No muscle, back pain, joint pain or stiffness.  LYMPHATICS: No enlarged nodes. No history of splenectomy.  PSYCHIATRIC: No history of depression or anxiety.  ENDOCRINOLOGIC: No reports of sweating, cold or heat intolerance. No polyuria or polydipsia.  Marland Kitchen   Physical Examination Filed Vitals:   06/21/15 1007  BP: 110/70  Pulse: 78   Filed Vitals:   06/21/15 1007  Height: 5' (1.524 m)  Weight: 91 lb (41.277 kg)     Gen: resting comfortably, no acute distress HEENT: no scleral icterus, pupils equal round and reactive, no palptable cervical adenopathy,  CV: RRR, no m/r/g, nO JVD Resp: Clear to auscultation bilaterally GI: abdomen is soft, non-tender, non-distended, normal bowel sounds, no hepatosplenomegaly MSK: extremities are warm, no edema.  Skin: warm, no rash Neuro:  no focal deficits Psych: appropriate affect   Diagnostic Studies 06/2015 echo Study Conclusions  - Left ventricle: The cavity size was normal. Wall thickness was normal. Doppler parameters are consistent with abnormal left ventricular relaxation (grade 1 diastolic dysfunction). - Aortic valve: There was trivial regurgitation. - Aorta: There is severe atherosclerosis in the aortic arch and descending thoracic aorta. The suprarenal abdominal aorta is mildly aneurysmal (diameter 36 mm), with mural thrombus, and heavy spontaneous echo contrast. No intimal flap is seen. - Atrial septum: The septum bowed from right to left, consistent with increased right atrial pressure. - Tricuspid valve: Moderate, holosystolicprolapse. There was moderate regurgitation. - Pericardium, extracardiac: A small to moderate pericardial effusion was identified circumferential to the heart.   06/2015 CT C/A/P IMPRESSION: 1. Fusiform descending thoracic aortic aneurysm extending to the juxtarenal abdominal aorta, as detailed above, 5.3 cm maximum transverse diameter. 2. Negative for acute PE or aortic Dissection. 3. 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   05/2015 Carotid US IMPRESSION: 1. Mild bilateral carotid bifurcation plaque, with less than 50% diameter stenosis. The exam does not exclude plaque ulceration or embolization. Continued surveillance recommended.  Assessment and Plan   1. Chronic systolic HF - reported diagnosis 14 years ago, we will request records.  Recent echo shows mild LV systolic dysfunction at 56-31% - continue beta blocker and ARB, low normal bp's today, will avoid titrating at this time - notes some atypical chest pain at times, she has anteroseptal hypokinesis on echo. Will obtain lexiscan MPI  2. Preoperative evaluation - being considered for aortic aneurysm repair - by history she is not able to tolerate greater than 4 METs. Echo with mildy decreased LVEF and WMA, will obtain Lexiscan MPI to further risk stratify   F/u 2 months. Further preop recs pending stress results.      Arnoldo Lenis, M.D.   Addendum 06/23/2015 Carlton Adam shows evidence of old infarcts, fairly minimal periinfarct ischemia with minimal myocardium at jeopardy. From cardiac standpoint would recommend proceeding as planned with aneurysm repair, continue cardiac meds in perioperative period.   Zandra Abts MD

## 2015-06-21 NOTE — Patient Instructions (Signed)
Your physician recommends that you schedule a follow-up appointment in: 2 MONTHS WITH DR. [facility]  Your physician recommends that you continue on your current medications as directed. Please refer to the Current Medication list given to you today.  Your physician has requested that you have a lexiscan myoview. For further information please visit [email or web address]. Please follow instruction sheet, as given.  WE WILL REQUEST RECORDS FROM [facility]   Thank you for choosing [facility]!!

## 2015-06-22 ENCOUNTER — Ambulatory Visit (HOSPITAL_COMMUNITY): Payer: Medicare (Managed Care) | Attending: Cardiology

## 2015-06-22 DIAGNOSIS — R9439 Abnormal result of other cardiovascular function study: Secondary | ICD-10-CM | POA: Diagnosis not present

## 2015-06-22 DIAGNOSIS — R0602 Shortness of breath: Secondary | ICD-10-CM | POA: Diagnosis not present

## 2015-06-22 LAB — MYOCARDIAL PERFUSION IMAGING
CHL CUP NUCLEAR SDS: 7
CSEPPHR: 92 {beats}/min
LVDIAVOL: 54 mL
LVSYSVOL: 30 mL
RATE: 0.26
Rest HR: 71 {beats}/min
SRS: 2
SSS: 9
TID: 0.86

## 2015-06-22 MED ORDER — TECHNETIUM TC 99M SESTAMIBI GENERIC - CARDIOLITE
32.2000 | Freq: Once | INTRAVENOUS | Status: AC | PRN
  Administered 2015-06-22: 32.2 via INTRAVENOUS

## 2015-06-22 MED ORDER — REGADENOSON 0.4 MG/5ML IV SOLN
0.4000 mg | Freq: Once | INTRAVENOUS | Status: AC
  Administered 2015-06-22: 0.4 mg via INTRAVENOUS

## 2015-06-22 MED ORDER — TECHNETIUM TC 99M SESTAMIBI GENERIC - CARDIOLITE
9.4000 | Freq: Once | INTRAVENOUS | Status: AC | PRN
  Administered 2015-06-22: 9 via INTRAVENOUS

## 2015-06-23 ENCOUNTER — Telehealth: Payer: Self-pay | Admitting: *Deleted

## 2015-06-23 NOTE — Telephone Encounter (Signed)
-----   Message from Arnoldo Lenis, MD sent at 06/23/2015  3:47 PM EDT ----- Stress test shows evidence of old blockages, no significant evidence of any new blockages. From my standpoint I am ok with her undergoing her surgery.  Zandra Abts MD

## 2015-06-23 NOTE — Telephone Encounter (Signed)
Pt aware, routed to Dr. Bridgett Larsson as requested. Pt would like to thank Dr. Harl Bowie for being so patient and thorough with her and had a wonderful experience. Will forward to Dr Harl Bowie as Juluis Rainier

## 2015-06-25 ENCOUNTER — Encounter: Payer: Medicare (Managed Care) | Admitting: Vascular Surgery

## 2015-07-01 ENCOUNTER — Encounter: Payer: Self-pay | Admitting: Vascular Surgery

## 2015-07-02 ENCOUNTER — Encounter: Payer: Self-pay | Admitting: Vascular Surgery

## 2015-07-02 ENCOUNTER — Ambulatory Visit (INDEPENDENT_AMBULATORY_CARE_PROVIDER_SITE_OTHER): Payer: Medicare (Managed Care) | Admitting: Vascular Surgery

## 2015-07-02 VITALS — BP 125/82 | HR 76 | Temp 98.2°F | Resp 18 | Ht 60.0 in | Wt 93.0 lb

## 2015-07-02 DIAGNOSIS — I716 Thoracoabdominal aortic aneurysm, without rupture, unspecified: Secondary | ICD-10-CM

## 2015-07-02 NOTE — Progress Notes (Signed)
Established Abdominal Aortic Aneurysm  History of Present Illness  The patient is a 81 y.o. (10/31/1929) female who presents with chief complaint: follow up from cardiac evaluation.  Previous studies demonstrate a Crawford 1 TAAA with likely sx thoracic PAU.  I sent the patient for cardiac evaluation prior to considering a TEVAR coverage of the thoracic PAU.  Her cardiologist has cleared her to proceed with repair.  The patient continues to have intermittent L back pain requiring intermittent narcotic use.  Past Medical History  Diagnosis Date  . Hypertension   . Hypercholesteremia   . Thyroid disease   . CHF (congestive heart failure)   . Degenerative disc disease, lumbar   . Cancer   . Anemia   . COPD (chronic obstructive pulmonary disease)     Past Surgical History  Procedure Laterality Date  . Abdominal hysterectomy    . Thyroidectomy    . Stomach surgery      History   Social History  . Marital Status: Single    Spouse Name: N/A  . Number of Children: N/A  . Years of Education: N/A   Occupational History  . Not on file.   Social History Main Topics  . Smoking status: Former Smoker -- 30 years    Types: Cigarettes  . Smokeless tobacco: Never Used  . Alcohol Use: No  . Drug Use: No  . Sexual Activity: Not on file   Other Topics Concern  . Not on file   Social History Narrative    Family History  Problem Relation Age of Onset  . Heart disease Sister     before age 41  . Deep vein thrombosis Daughter   . COPD Daughter   . Hyperlipidemia Daughter   . Hypertension Daughter   . COPD Son   . Hyperlipidemia Son   . Hypertension Son      Current Outpatient Prescriptions  Medication Sig Dispense Refill  . aspirin 81 MG tablet Take 81 mg by mouth daily.    Marland Kitchen atorvastatin (LIPITOR) 20 MG tablet Take 20 mg by mouth daily.    . Cholecalciferol (VITAMIN D3) 50000 UNITS CAPS Take 1 capsule by mouth once a week.    . famotidine (PEPCID) 20 MG tablet Take 20  mg by mouth 2 (two) times daily.    Marland Kitchen levothyroxine (SYNTHROID, LEVOTHROID) 100 MCG tablet Take 100 mcg by mouth daily before breakfast.    . losartan (COZAAR) 25 MG tablet Take 25 mg by mouth daily.    . metoprolol succinate (TOPROL-XL) 50 MG 24 hr tablet Take 50 mg by mouth daily. Take with or immediately following a meal.    . traMADol (ULTRAM) 50 MG tablet Take by mouth every 12 (twelve) hours as needed.    . predniSONE (DELTASONE) 20 MG tablet      No current facility-administered medications for this visit.     Allergies  Allergen Reactions  . Enalapril     Tongue swelling      REVIEW OF SYSTEMS: (Positives checked otherwise negative)  CARDIOVASCULAR:  [x]  chest pain,  [x]  chest pressure,  [x]  palpitations,  [x]  shortness of breath when laying flat,  [x]  shortness of breath with exertion,  [ ]  pain in feet when walking,  [ ]  pain in feet when laying flat, [ ]  history of blood clot in veins (DVT),  [ ]  history of phlebitis,  [ ]  swelling in legs,  [ ]  varicose veins  PULMONARY:  [ ]  productive cough,  [ ]   asthma,  [ ]  wheezing  NEUROLOGIC:  [x]  weakness in arms or legs,  [x]  numbness in arms or legs,  [x]  raspy voice since thyroidectomy, worsened over 6 months [ ]  difficulty speaking or slurred speech,  [ ]  temporary loss of vision in one eye,  [ ]  dizziness  HEMATOLOGIC:  [ ]  bleeding problems,  [ ]  problems with blood clotting too easily  MUSCULOSKEL:  [ ]  joint pain, [ ]  joint swelling  GASTROINTEST:  [ ]  vomiting blood,  [ ]  blood in stool   GENITOURINARY:  [ ]  burning with urination,  [ ]  blood in urine  PSYCHIATRIC:  [ ]  history of major depression  INTEGUMENTARY:  [ ]  rashes,  [ ]  ulcers  CONSTITUTIONAL:  [x]  fever,  [ ]  chills   For VQI Use Only   PRE-ADM LIVING: Home  AMB STATUS: Ambulatory  CAD Sx: None  PRIOR CHF: Mild  STRESS TEST: [x]  No, [ ]  Normal, [ ]  +  ischemia, [ ]  + MI, [ ]  Both    Physical Examination  Filed Vitals:   07/02/15 1128  BP: 125/82  Pulse: 76  Temp: 98.2 F (36.8 C)  Resp: 18  Height: 5' (1.524 m)  Weight: 93 lb (42.185 kg)  SpO2: 99%   Body mass index is 18.16 kg/(m^2).  General: A&O x 3, WD, Cachectic, raspy voice  Pulmonary: Sym exp, good air movt, CTAB, no rales, rhonchi, & wheezing  Cardiac: RRR, Nl S1, S2, no Murmurs, rubs or gallops  Vascular: Vessel Right Left  Radial Palpable Palpable  Brachial Palpable Palpable  Carotid Palpable, without bruit Palpable, without bruit  Aorta Not palpable N/A  Femoral Palpable Palpable  Popliteal Not palpable Not palpable  PT Palpable Palpable  DP Palpable Palpable   Gastrointestinal: soft, NTND, no G/R, no HSM, no masses, no CVAT B  Musculoskeletal: M/S 5/5 throughout , Extremities without ischemic changes   Neurologic:  Pain and light touch intact in extremities , Motor exam as listed above  Medical Decision Making  The patient is a 81 y.o. female who presents with: Crawford Type 1 TAAA with sx thoracic penetrating aortic ulcer, chronic HF   I have reviewed her chest CTA with my [employer] rep and he feels there is inadequate proximal neck to place a TEVAR without covering the L SCA.  I discussed the implications with the patient and daughter.  To facilitate, TEVAR coverage the thoracic PAU, a L SCA to CCA transposition or bypass would be necessary.  This patient has a bovine arch, so either procedure would give [place] an additional 4-5 cm of seal.  I would stage this procedure, doing the transposition or bypass then proceeding in 2-3 weeks with the TEVAR if there were no complications with the first procedure. I discussed with the patient the risks, benefits, and alternatives to subclavian to carotid transposition or bypass. I discussed the procedural details of the procedure with the patient.   The patient is aware that the risks of the procedure include but are  not limited to: bleeding, infection, stroke, myocardial infarction, death, cranial nerve injuries both temporary and permanent, neck hematoma, possible airway compromise, phrenic nerve injury, and thoracic duct injury, and possible need for additional interventions in the future.  The patient is aware of the risks and agrees to proceed forward with the procedure. I will discuss the thoracic endograft repair of the thoracic PAU upon follow-up to avoid confusing the patient.    Adele Barthel, MD [facility] Office: [phone] Pager: [phone]  07/02/2015, 12:48 PM

## 2015-07-05 ENCOUNTER — Other Ambulatory Visit: Payer: Self-pay

## 2015-07-08 ENCOUNTER — Emergency Department (HOSPITAL_COMMUNITY)
Admission: EM | Admit: 2015-07-08 | Discharge: 2015-07-08 | Disposition: A | Discharge status: Home / Self Care | Payer: Medicare (Managed Care) | Attending: Emergency Medicine | Admitting: Emergency Medicine

## 2015-07-08 ENCOUNTER — Encounter (HOSPITAL_COMMUNITY)
Admission: RE | Admit: 2015-07-08 | Discharge: 2015-07-08 | Disposition: A | Discharge status: Home / Self Care | Payer: Medicare (Managed Care) | Source: Ambulatory Visit | Attending: Vascular Surgery | Admitting: Vascular Surgery

## 2015-07-08 ENCOUNTER — Encounter (HOSPITAL_COMMUNITY): Payer: Self-pay

## 2015-07-08 ENCOUNTER — Other Ambulatory Visit: Payer: Self-pay

## 2015-07-08 ENCOUNTER — Encounter (HOSPITAL_COMMUNITY): Payer: Self-pay | Admitting: *Deleted

## 2015-07-08 DIAGNOSIS — I712 Thoracic aortic aneurysm, without rupture: Secondary | ICD-10-CM | POA: Diagnosis not present

## 2015-07-08 DIAGNOSIS — Z8719 Personal history of other diseases of the digestive system: Secondary | ICD-10-CM | POA: Insufficient documentation

## 2015-07-08 DIAGNOSIS — I10 Essential (primary) hypertension: Secondary | ICD-10-CM | POA: Insufficient documentation

## 2015-07-08 DIAGNOSIS — R112 Nausea with vomiting, unspecified: Secondary | ICD-10-CM | POA: Insufficient documentation

## 2015-07-08 DIAGNOSIS — R001 Bradycardia, unspecified: Secondary | ICD-10-CM | POA: Insufficient documentation

## 2015-07-08 DIAGNOSIS — Z7982 Long term (current) use of aspirin: Secondary | ICD-10-CM | POA: Diagnosis not present

## 2015-07-08 DIAGNOSIS — E079 Disorder of thyroid, unspecified: Secondary | ICD-10-CM | POA: Insufficient documentation

## 2015-07-08 DIAGNOSIS — Z01818 Encounter for other preprocedural examination: Secondary | ICD-10-CM | POA: Diagnosis present

## 2015-07-08 DIAGNOSIS — Z87891 Personal history of nicotine dependence: Secondary | ICD-10-CM | POA: Diagnosis not present

## 2015-07-08 DIAGNOSIS — Z880 Allergy status to penicillin: Secondary | ICD-10-CM | POA: Diagnosis not present

## 2015-07-08 DIAGNOSIS — Z862 Personal history of diseases of the blood and blood-forming organs and certain disorders involving the immune mechanism: Secondary | ICD-10-CM | POA: Insufficient documentation

## 2015-07-08 DIAGNOSIS — J449 Chronic obstructive pulmonary disease, unspecified: Secondary | ICD-10-CM | POA: Insufficient documentation

## 2015-07-08 DIAGNOSIS — Z8701 Personal history of pneumonia (recurrent): Secondary | ICD-10-CM | POA: Diagnosis not present

## 2015-07-08 DIAGNOSIS — E780 Pure hypercholesterolemia: Secondary | ICD-10-CM | POA: Insufficient documentation

## 2015-07-08 DIAGNOSIS — M6281 Muscle weakness (generalized): Secondary | ICD-10-CM | POA: Insufficient documentation

## 2015-07-08 DIAGNOSIS — I509 Heart failure, unspecified: Secondary | ICD-10-CM | POA: Diagnosis not present

## 2015-07-08 DIAGNOSIS — Z91040 Latex allergy status: Secondary | ICD-10-CM | POA: Insufficient documentation

## 2015-07-08 DIAGNOSIS — Z79899 Other long term (current) drug therapy: Secondary | ICD-10-CM | POA: Diagnosis not present

## 2015-07-08 HISTORY — DX: Gastro-esophageal reflux disease without esophagitis: K21.9

## 2015-07-08 HISTORY — DX: Other specified postprocedural states: R11.2

## 2015-07-08 HISTORY — DX: Cardiac arrhythmia, unspecified: I49.9

## 2015-07-08 HISTORY — DX: Reserved for inherently not codable concepts without codable children: IMO0001

## 2015-07-08 HISTORY — DX: Other specified postprocedural states: Z98.890

## 2015-07-08 HISTORY — DX: Pneumonia, unspecified organism: J18.9

## 2015-07-08 LAB — COMPREHENSIVE METABOLIC PANEL
ALT: 8 U/L — ABNORMAL LOW (ref 14–54)
AST: 21 U/L (ref 15–41)
Albumin: 3.5 g/dL (ref 3.5–5.0)
Alkaline Phosphatase: 120 U/L (ref 38–126)
Anion gap: 11 (ref 5–15)
BILIRUBIN TOTAL: 0.3 mg/dL (ref 0.3–1.2)
BUN: 11 mg/dL (ref 6–20)
CALCIUM: 9.3 mg/dL (ref 8.9–10.3)
CHLORIDE: 99 mmol/L — AB (ref 101–111)
CO2: 27 mmol/L (ref 22–32)
CREATININE: 1.14 mg/dL — AB (ref 0.44–1.00)
GFR, EST AFRICAN AMERICAN: 50 mL/min — AB (ref >60)
GFR, EST NON AFRICAN AMERICAN: 43 mL/min — AB (ref >60)
Glucose, Bld: 154 mg/dL — ABNORMAL HIGH (ref 65–99)
POTASSIUM: 4 mmol/L (ref 3.5–5.1)
SODIUM: 137 mmol/L (ref 135–145)
TOTAL PROTEIN: 7.7 g/dL (ref 6.5–8.1)

## 2015-07-08 LAB — CBC
HCT: 42.6 % (ref 36.0–46.0)
Hemoglobin: 13.9 g/dL (ref 12.0–15.0)
MCH: 26.5 pg (ref 26.0–34.0)
MCHC: 32.6 g/dL (ref 30.0–36.0)
MCV: 81.3 fL (ref 78.0–100.0)
Platelets: 404 10*3/uL — ABNORMAL HIGH (ref 150–400)
RBC: 5.24 MIL/uL — AB (ref 3.87–5.11)
RDW: 18 % — ABNORMAL HIGH (ref 11.5–15.5)
WBC: 8.7 10*3/uL (ref 4.0–10.5)

## 2015-07-08 LAB — LIPASE, BLOOD: Lipase: 19 U/L — ABNORMAL LOW (ref 22–51)

## 2015-07-08 NOTE — ED Provider Notes (Signed)
CSN: [id number]     Arrival date & time 07/08/15  1456 History   First MD Initiated Contact with Patient 07/08/15 1741     Chief Complaint  Patient presents with  . Nausea  . Extremity Weakness     (Consider location/radiation/quality/duration/timing/severity/associated sxs/prior Treatment) HPI Comments: 82 year old female with past medical history including hypertension, CHF, COPD, hypothyroidism, recently diagnosed AAA who presents with nausea and vomiting. Patient has had nausea for the past one week. Daughter states that they were at their preop appointment today and the patient had one episode of vomiting. They were sent from preop here for evaluation. Daughter suspects that the vomiting was because the patient had not eaten much today. Patient endorses poor appetite which is a chronic problem. She has been drinking juice since she vomited without any further episodes of vomiting. She denies any abdominal pain, diarrhea, urinary symptoms, fevers, chest pain, or shortness of breath. She does have some back pain that she has had since the diagnosis of AAA but no new pain. Patient states that she currently feels well.  Patient is a 81 y.o. female presenting with extremity weakness. The history is provided by the patient and a relative.  Extremity Weakness    Past Medical History  Diagnosis Date  . Hypertension   . Hypercholesteremia   . Thyroid disease   . CHF (congestive heart failure)   . Degenerative disc disease, lumbar   . Anemia   . COPD (chronic obstructive pulmonary disease)   . PONV (postoperative nausea and vomiting)   . Anginal pain   . Dysrhythmia     'three trollop HB at one point' per daughter  . Shortness of breath dyspnea     with aneurysm  . Pneumonia 2015  . S/P thyroidectomy   . H/O: GI bleed   . GERD (gastroesophageal reflux disease)    Past Surgical History  Procedure Laterality Date  . Abdominal hysterectomy    . Stomach surgery      tumor removal from  abdomen (benign)  . Thyroidectomy  [facility]  . Cardiac catheterization      14 years ago  . Eye surgery      Nov 2015and Jan 2016 [place], [place]   Family History  Problem Relation Age of Onset  . Heart disease Sister     before age 39  . Deep vein thrombosis Daughter   . COPD Daughter   . Hyperlipidemia Daughter   . Hypertension Daughter   . COPD Son   . Hyperlipidemia Son   . Hypertension Son    History  Substance Use Topics  . Smoking status: Former Smoker -- 30 years    Types: Cigarettes  . Smokeless tobacco: Never Used  . Alcohol Use: No   OB History    No data available     Review of Systems  Musculoskeletal: Positive for extremity weakness.    10 Systems reviewed and are negative for acute change except as noted in the HPI.   Allergies  Enalapril; Latex; and Penicillins  Home Medications   Prior to Admission medications   Medication Sig Start Date End Date Taking? Authorizing Provider  aspirin 81 MG tablet Take 81 mg by mouth daily.    Historical Provider, MD  atorvastatin (LIPITOR) 20 MG tablet Take 20 mg by mouth daily.    Historical Provider, MD  Cholecalciferol (VITAMIN D3) 50000 UNITS CAPS Take 50,000 Units by mouth once a week. On Sunday.  Historical Provider, MD  famotidine (PEPCID) 20 MG tablet Take 20 mg by mouth daily as needed for heartburn or indigestion.     Historical Provider, MD  levothyroxine (SYNTHROID, LEVOTHROID) 100 MCG tablet Take 100 mcg by mouth daily before breakfast.    Historical Provider, MD  losartan (COZAAR) 25 MG tablet Take 25 mg by mouth daily.    Historical Provider, MD  metoprolol succinate (TOPROL-XL) 50 MG 24 hr tablet Take 50 mg by mouth daily. Take with or immediately following a meal.    Historical Provider, MD  traMADol (ULTRAM) 50 MG tablet Take 50 mg by mouth every 12 (twelve) hours as needed for moderate pain.     Historical Provider, MD   BP 132/94 mmHg  Pulse 80  Temp(Src) 97.6 F (36.4 C) (Oral)   Resp 16  SpO2 92% Physical Exam  Constitutional: She is oriented to person, place, and time.  Thin, frail-appearing woman in no acute distress  HENT:  Head: Normocephalic and atraumatic.  Moist mucous membranes  Eyes: Conjunctivae are normal. Pupils are equal, round, and reactive to light.  Neck: Neck supple.  Cardiovascular: Regular rhythm.   No murmur heard. Sinus bradycardia, S2 click  Pulmonary/Chest: Effort normal and breath sounds normal.  Abdominal: Soft. Bowel sounds are normal. She exhibits no distension. There is no tenderness.  Musculoskeletal: She exhibits no edema.  Neurological: She is alert and oriented to person, place, and time.  Fluent speech  Skin: Skin is warm and dry.  Psychiatric: She has a normal mood and affect. Judgment normal.  Nursing note and vitals reviewed.   ED Course  Procedures (including critical care time) Labs Review Labs Reviewed  LIPASE, BLOOD - Abnormal; Notable for the following:    Lipase 19 (*)    All other components within normal limits  COMPREHENSIVE METABOLIC PANEL - Abnormal; Notable for the following:    Chloride 99 (*)    Glucose, Bld 154 (*)    Creatinine, Ser 1.14 (*)    ALT 8 (*)    GFR calc non Af Amer 43 (*)    GFR calc Af Amer 50 (*)    All other components within normal limits  CBC - Abnormal; Notable for the following:    RBC 5.24 (*)    RDW 18.0 (*)    Platelets 404 (*)    All other components within normal limits  URINALYSIS, ROUTINE W REFLEX MICROSCOPIC (NOT AT [facility])    Imaging Review No results found.   EKG Interpretation   Date/Time:  Thursday July 08 2015 17:59:07 EDT Ventricular Rate:  80 PR Interval:  186 QRS Duration: 84 QT Interval:  493 QTC Calculation: 569 R Axis:   -69 Text Interpretation:  Sinus rhythm Probable left atrial enlargement Left  anterior fascicular block RSR' in V1 or V2, right VCD or RVH Left  ventricular hypertrophy Anterior Q waves, possibly due to LVH Nonspecific  T  abnormalities, inferior leads Prolonged QT interval Confirmed by LITTLE  MD, RACHEL ([id number]) on 07/08/2015 6:06:03 PM      MDM   Final diagnoses:  Non-intractable vomiting with nausea, vomiting of unspecified type    81 year old female sent from preop clinic for 1 episode of vomiting in the setting of one week of nausea. Patient has been tolerating juice since the one episode of vomiting without any further nausea. Vital signs reassuring and lab work above is consistent with her baseline. Unable to obtain UA is patient states that she will not be able  to produce a urine sample anytime soon. Daughter feels that patient is at her baseline and the only reason they presented and was because they were instructed to do so by preop. Patient denies any new pain concerning for rupture of AAA. Patient already has follow-up arranged for surgery in 2 days. Because patient is completely asymptomatic with no other associated symptoms, I feel that she was sick for discharge home. Reviewed return precautions with the patient and her daughter and they voiced understanding. Patient discharged in satisfactory condition.  Sharlett Iles, MD 07/08/15 [phone]

## 2015-07-08 NOTE — Discharge Instructions (Signed)

## 2015-07-08 NOTE — ED Notes (Addendum)
Pt reports n/v starting 3 days ago. Pt states that she does not feel nauseated at this time. Denies abdominal pain. Pt states that her feet feels numb and cold. Cap refil and pulses present.

## 2015-07-08 NOTE — Progress Notes (Signed)
Spoke with Colletta Maryland at Dr. Lianne Moris office and informed her of patient's status.  Also received instructions regarding patient's aspirin; that she is to continue taking it and take it the morning of surgery as well.  Will document in chart.

## 2015-07-08 NOTE — Progress Notes (Signed)
Patient had:  ECHO: 06/20/15  Stress Test: 06/22/2015   EKG: 06/10/2015  Has pre-op clearance by Dr. Harl Bowie in a telephone note from 06/23/2015.

## 2015-07-08 NOTE — Progress Notes (Signed)
Patient started vomiting in middle of PAT appt.  Daughter stated patient has been nauseous for past 2 days.  Given patient's medical issues, I took her down to the Emergency Department right away.  Was unable to complete PAT appt.  Will let Dr. Lianne Moris office know.

## 2015-07-08 NOTE — ED Notes (Signed)
In pre op today, having aortic aneurysm repair soon when reported nausea and left sided weakness to staff. Sent to ED for eval. Reports feels back to self/better now.  Unable to provide urine specimen at this time.

## 2015-07-13 ENCOUNTER — Encounter (HOSPITAL_COMMUNITY): Payer: Self-pay

## 2015-07-13 ENCOUNTER — Encounter (HOSPITAL_COMMUNITY)
Admission: RE | Admit: 2015-07-13 | Discharge: 2015-07-13 | Disposition: A | Discharge status: Home / Self Care | Payer: Medicare (Managed Care) | Source: Ambulatory Visit | Attending: Vascular Surgery | Admitting: Vascular Surgery

## 2015-07-13 HISTORY — DX: Hypothyroidism, unspecified: E03.9

## 2015-07-13 LAB — APTT: aPTT: 39 seconds — ABNORMAL HIGH (ref 24–37)

## 2015-07-13 LAB — SURGICAL PCR SCREEN
MRSA, PCR: NEGATIVE
STAPHYLOCOCCUS AUREUS: NEGATIVE

## 2015-07-13 LAB — PROTIME-INR
INR: 1.17 (ref 0.00–1.49)
Prothrombin Time: 15.1 seconds (ref 11.6–15.2)

## 2015-07-13 MED ORDER — CHLORHEXIDINE GLUCONATE 4 % EX LIQD: 60.0000 mL | Freq: Once | CUTANEOUS | Status: DC

## 2015-07-13 NOTE — Pre-Procedure Instructions (Signed)
Lindsay Park  07/13/2015      [id number] - [place], [place] - [address]OSS [address] [place] [address] Phone: [phone] Fax: [phone]    Your procedure is scheduled on Wed, Aug 10 @ 8:30 AM  Report to [facility] Admitting at 6:30 AM.  Call this number if you have problems the morning of surgery:  [phone]   Remember:  Do not eat food or drink liquids after midnight.  Take these medicines THE MORNING OF SURGERY with A SIP OF WATER Aspirin,Pepcid(Famotidine),Synthroid(Levothyroxine),Metoprolol(Toprol),and Tramadol(Ultram-if needed)              No Goody's,BC's,Aleve,Ibuprofen,Fish Oil,or any Herbal Medications.    Do not wear jewelry, make-up or nail polish.  Do not wear lotions, powders, or perfumes.   Do not shave 48 hours prior to surgery.   Do not bring valuables to the hospital.  [facility] is not responsible for any belongings or valuables.  Contacts, dentures or bridgework may not be worn into surgery.  Leave your suitcase in the car.  After surgery it may be brought to your room.  For patients admitted to the hospital, discharge time will be determined by your treatment team.  Patients discharged the day of surgery will not be allowed to drive home.    Special instructions:  [place] - Preparing for Surgery  Before surgery, you can play an important role.  Because skin is not sterile, your skin needs to be as free of germs as possible.  You can reduce the number of germs on you skin by washing with CHG (chlorahexidine gluconate) soap before surgery.  CHG is an antiseptic cleaner which kills germs and bonds with the skin to continue killing germs even after washing.  Please DO NOT use if you have an allergy to CHG or antibacterial soaps.  If your skin becomes reddened/irritated stop using the CHG and inform your nurse when you arrive at Short Stay.  Do not shave (including legs and  underarms) for at least 48 hours prior to the first CHG shower.  You may shave your face.  Please follow these instructions carefully:   1.  Shower with CHG Soap the night before surgery and the                                morning of Surgery.  2.  If you choose to wash your hair, wash your hair first as usual with your       normal shampoo.  3.  After you shampoo, rinse your hair and body thoroughly to remove the                      Shampoo.  4.  Use CHG as you would any other liquid soap.  You can apply chg directly       to the skin and wash gently with scrungie or a clean washcloth.  5.  Apply the CHG Soap to your body ONLY FROM THE NECK DOWN.        Do not use on open wounds or open sores.  Avoid contact with your eyes,       ears, mouth and genitals (private parts).  Wash genitals (private parts)       with your normal soap.  6.  Wash thoroughly, paying special attention to the area  where your surgery        will be performed.  7.  Thoroughly rinse your body with warm water from the neck down.  8.  DO NOT shower/wash with your normal soap after using and rinsing off       the CHG Soap.  9.  Pat yourself dry with a clean towel.            10.  Wear clean pajamas.            11.  Place clean sheets on your bed the night of your first shower and do not        sleep with pets.  Day of Surgery  Do not apply any lotions/deoderants the morning of surgery.  Please wear clean clothes to the hospital/surgery center.    Please read over the following fact sheets that you were given. Pain Booklet, Coughing and Deep Breathing, Blood Transfusion Information, Surgical Site Infection Prevention and Anesthesia Post-op Instructions

## 2015-07-13 NOTE — Progress Notes (Addendum)
Clearance note in chart from [place] MD is Dr.Robert Bradd Burner  Echo reports in epic from 2006/2016  Stress test report in epic from 06-22-15  EKG in epic from 07-08-15  Heart cath  CXR

## 2015-07-14 ENCOUNTER — Inpatient Hospital Stay (HOSPITAL_COMMUNITY): Payer: Medicare (Managed Care)

## 2015-07-14 ENCOUNTER — Inpatient Hospital Stay (HOSPITAL_COMMUNITY)
Admission: AD | Admit: 2015-07-14 | Discharge: 2015-07-16 | DRG: 252 | Disposition: A | Discharge status: Home / Self Care | Payer: Medicare (Managed Care) | Source: Ambulatory Visit | Attending: Vascular Surgery | Admitting: Vascular Surgery

## 2015-07-14 ENCOUNTER — Encounter (HOSPITAL_COMMUNITY)
Admission: AD | Disposition: A | Discharge status: Home / Self Care | Payer: Self-pay | Source: Ambulatory Visit | Attending: Vascular Surgery

## 2015-07-14 ENCOUNTER — Encounter (HOSPITAL_COMMUNITY): Payer: Self-pay | Admitting: *Deleted

## 2015-07-14 ENCOUNTER — Inpatient Hospital Stay (HOSPITAL_COMMUNITY): Payer: Medicare (Managed Care) | Admitting: Vascular Surgery

## 2015-07-14 ENCOUNTER — Inpatient Hospital Stay (HOSPITAL_COMMUNITY): Payer: Medicare (Managed Care) | Admitting: Anesthesiology

## 2015-07-14 DIAGNOSIS — I714 Abdominal aortic aneurysm, without rupture: Secondary | ICD-10-CM | POA: Diagnosis present

## 2015-07-14 DIAGNOSIS — M5136 Other intervertebral disc degeneration, lumbar region: Secondary | ICD-10-CM | POA: Diagnosis present

## 2015-07-14 DIAGNOSIS — J385 Laryngeal spasm: Secondary | ICD-10-CM | POA: Diagnosis present

## 2015-07-14 DIAGNOSIS — J384 Edema of larynx: Secondary | ICD-10-CM | POA: Diagnosis not present

## 2015-07-14 DIAGNOSIS — I712 Thoracic aortic aneurysm, without rupture, unspecified: Secondary | ICD-10-CM | POA: Diagnosis present

## 2015-07-14 DIAGNOSIS — D649 Anemia, unspecified: Secondary | ICD-10-CM | POA: Diagnosis present

## 2015-07-14 DIAGNOSIS — Z87891 Personal history of nicotine dependence: Secondary | ICD-10-CM | POA: Diagnosis not present

## 2015-07-14 DIAGNOSIS — Z7982 Long term (current) use of aspirin: Secondary | ICD-10-CM | POA: Diagnosis not present

## 2015-07-14 DIAGNOSIS — E876 Hypokalemia: Secondary | ICD-10-CM | POA: Diagnosis not present

## 2015-07-14 DIAGNOSIS — K219 Gastro-esophageal reflux disease without esophagitis: Secondary | ICD-10-CM | POA: Diagnosis present

## 2015-07-14 DIAGNOSIS — E780 Pure hypercholesterolemia: Secondary | ICD-10-CM | POA: Diagnosis present

## 2015-07-14 DIAGNOSIS — E039 Hypothyroidism, unspecified: Secondary | ICD-10-CM | POA: Diagnosis present

## 2015-07-14 DIAGNOSIS — J9690 Respiratory failure, unspecified, unspecified whether with hypoxia or hypercapnia: Secondary | ICD-10-CM

## 2015-07-14 DIAGNOSIS — J432 Centrilobular emphysema: Secondary | ICD-10-CM | POA: Diagnosis not present

## 2015-07-14 DIAGNOSIS — J95821 Acute postprocedural respiratory failure: Secondary | ICD-10-CM | POA: Diagnosis not present

## 2015-07-14 DIAGNOSIS — J449 Chronic obstructive pulmonary disease, unspecified: Secondary | ICD-10-CM | POA: Diagnosis present

## 2015-07-14 DIAGNOSIS — R0602 Shortness of breath: Secondary | ICD-10-CM

## 2015-07-14 DIAGNOSIS — I711 Thoracic aortic aneurysm, ruptured: Secondary | ICD-10-CM

## 2015-07-14 DIAGNOSIS — I10 Essential (primary) hypertension: Secondary | ICD-10-CM | POA: Diagnosis present

## 2015-07-14 HISTORY — PX: CAROTID-SUBCLAVIAN BYPASS GRAFT: SHX910

## 2015-07-14 LAB — URINALYSIS, ROUTINE W REFLEX MICROSCOPIC
Bilirubin Urine: NEGATIVE
GLUCOSE, UA: NEGATIVE mg/dL
Ketones, ur: NEGATIVE mg/dL
Leukocytes, UA: NEGATIVE
Nitrite: NEGATIVE
PH: 6.5 (ref 5.0–8.0)
PROTEIN: NEGATIVE mg/dL
SPECIFIC GRAVITY, URINE: 1.018 (ref 1.005–1.030)
Urobilinogen, UA: 0.2 mg/dL (ref 0.0–1.0)

## 2015-07-14 LAB — URINE MICROSCOPIC-ADD ON

## 2015-07-14 LAB — CREATININE, SERUM
Creatinine, Ser: 0.98 mg/dL (ref 0.44–1.00)
GFR calc Af Amer: 60 mL/min — ABNORMAL LOW (ref >60)
GFR calc non Af Amer: 52 mL/min — ABNORMAL LOW (ref >60)

## 2015-07-14 LAB — CBC
HEMATOCRIT: 32.1 % — AB (ref 36.0–46.0)
Hemoglobin: 10.5 g/dL — ABNORMAL LOW (ref 12.0–15.0)
MCH: 26.8 pg (ref 26.0–34.0)
MCHC: 32.7 g/dL (ref 30.0–36.0)
MCV: 81.9 fL (ref 78.0–100.0)
PLATELETS: 329 10*3/uL (ref 150–400)
RBC: 3.92 MIL/uL (ref 3.87–5.11)
RDW: 17.9 % — AB (ref 11.5–15.5)
WBC: 12.2 10*3/uL — AB (ref 4.0–10.5)

## 2015-07-14 SURGERY — CREATION, BYPASS, ARTERIAL, SUBCLAVIAN TO CAROTID, USING GRAFT
Anesthesia: General | Site: Neck | Laterality: Left

## 2015-07-14 MED ORDER — ENOXAPARIN SODIUM 30 MG/0.3ML ~~LOC~~ SOLN
20.0000 mg | SUBCUTANEOUS | Status: DC
  Administered 2015-07-15 – 2015-07-16 (×2): 20 mg via SUBCUTANEOUS
  Filled 2015-07-14 (×5): qty 0.2

## 2015-07-14 MED ORDER — HEPARIN SODIUM (PORCINE) 1000 UNIT/ML IJ SOLN
INTRAMUSCULAR | Status: DC | PRN
  Administered 2015-07-14: 4000 [IU] via INTRAVENOUS

## 2015-07-14 MED ORDER — POTASSIUM CHLORIDE CRYS ER 20 MEQ PO TBCR
20.0000 meq | EXTENDED_RELEASE_TABLET | Freq: Every day | ORAL | Status: AC | PRN
Start: 2015-07-14 — End: 2015-07-15
  Administered 2015-07-15: 40 meq via ORAL
  Filled 2015-07-14: qty 2

## 2015-07-14 MED ORDER — ROCURONIUM BROMIDE 100 MG/10ML IV SOLN
INTRAVENOUS | Status: DC | PRN
  Administered 2015-07-14: 40 mg via INTRAVENOUS

## 2015-07-14 MED ORDER — THROMBIN 20000 UNITS EX SOLR
CUTANEOUS | Status: AC
  Filled 2015-07-14: qty 20000

## 2015-07-14 MED ORDER — FENTANYL CITRATE (PF) 100 MCG/2ML IJ SOLN
INTRAMUSCULAR | Status: AC
  Filled 2015-07-14: qty 2

## 2015-07-14 MED ORDER — ONDANSETRON HCL 4 MG/2ML IJ SOLN: 4.0000 mg | Freq: Once | INTRAMUSCULAR | Status: DC | PRN

## 2015-07-14 MED ORDER — MORPHINE SULFATE 2 MG/ML IJ SOLN
1.0000 mg | Freq: Once | INTRAMUSCULAR | Status: AC
  Administered 2015-07-14: 1 mg via INTRAVENOUS

## 2015-07-14 MED ORDER — LIDOCAINE HCL (PF) 1 % IJ SOLN
INTRAMUSCULAR | Status: AC
  Filled 2015-07-14: qty 30

## 2015-07-14 MED ORDER — SODIUM CHLORIDE 0.9 % IV SOLN: 500.0000 mL | Freq: Once | INTRAVENOUS | Status: DC | PRN

## 2015-07-14 MED ORDER — MAGNESIUM SULFATE 2 GM/50ML IV SOLN: 2.0000 g | Freq: Every day | INTRAVENOUS | Status: DC | PRN

## 2015-07-14 MED ORDER — PROPOFOL 1000 MG/100ML IV EMUL
5.0000 ug/kg/min | INTRAVENOUS | Status: DC
  Administered 2015-07-14 (×2): 20 ug/kg/min via INTRAVENOUS
  Administered 2015-07-15: 30 ug/kg/min via INTRAVENOUS
  Filled 2015-07-14: qty 100

## 2015-07-14 MED ORDER — DEXAMETHASONE SODIUM PHOSPHATE 4 MG/ML IJ SOLN
INTRAMUSCULAR | Status: DC | PRN
  Administered 2015-07-14: 4 mg via INTRAVENOUS

## 2015-07-14 MED ORDER — LEVOTHYROXINE SODIUM 100 MCG PO TABS
100.0000 ug | ORAL_TABLET | Freq: Every day | ORAL | Status: DC
  Administered 2015-07-15 – 2015-07-16 (×2): 100 ug via ORAL
  Filled 2015-07-14 (×3): qty 1

## 2015-07-14 MED ORDER — PHENYLEPHRINE HCL 10 MG/ML IJ SOLN
0.0000 ug/min | INTRAVENOUS | Status: DC
  Administered 2015-07-14: 20 ug/min via INTRAVENOUS

## 2015-07-14 MED ORDER — SODIUM CHLORIDE 0.9 % IV SOLN
INTRAVENOUS | Status: DC
  Administered 2015-07-14 – 2015-07-15 (×2): via INTRAVENOUS

## 2015-07-14 MED ORDER — PANTOPRAZOLE SODIUM 40 MG PO TBEC: 40.0000 mg | DELAYED_RELEASE_TABLET | Freq: Every day | ORAL | Status: DC

## 2015-07-14 MED ORDER — HYDRALAZINE HCL 20 MG/ML IJ SOLN
5.0000 mg | INTRAMUSCULAR | Status: DC | PRN
  Administered 2015-07-14: 5 mg via INTRAVENOUS

## 2015-07-14 MED ORDER — LACTATED RINGERS IV SOLN
INTRAVENOUS | Status: DC | PRN
  Administered 2015-07-14 (×2): via INTRAVENOUS

## 2015-07-14 MED ORDER — PANTOPRAZOLE SODIUM 40 MG IV SOLR
40.0000 mg | INTRAVENOUS | Status: DC
  Administered 2015-07-14: 40 mg via INTRAVENOUS
  Filled 2015-07-14 (×2): qty 40

## 2015-07-14 MED ORDER — HYDRALAZINE HCL 20 MG/ML IJ SOLN
INTRAMUSCULAR | Status: AC
  Filled 2015-07-14: qty 1

## 2015-07-14 MED ORDER — PHENOL 1.4 % MT LIQD: 1.0000 | OROMUCOSAL | Status: DC | PRN

## 2015-07-14 MED ORDER — DOCUSATE SODIUM 100 MG PO CAPS
100.0000 mg | ORAL_CAPSULE | Freq: Every day | ORAL | Status: DC
  Administered 2015-07-15 – 2015-07-16 (×2): 100 mg via ORAL
  Filled 2015-07-14 (×2): qty 1

## 2015-07-14 MED ORDER — ACETAMINOPHEN 325 MG PO TABS: 325.0000 mg | ORAL_TABLET | ORAL | Status: DC | PRN

## 2015-07-14 MED ORDER — NITROGLYCERIN 0.2 MG/ML ON CALL CATH LAB
INTRAVENOUS | Status: DC | PRN
  Administered 2015-07-14: 40 ug via INTRAVENOUS

## 2015-07-14 MED ORDER — EPHEDRINE SULFATE 50 MG/ML IJ SOLN
INTRAMUSCULAR | Status: AC
  Filled 2015-07-14: qty 1

## 2015-07-14 MED ORDER — FENTANYL CITRATE (PF) 100 MCG/2ML IJ SOLN
50.0000 ug | INTRAMUSCULAR | Status: DC | PRN
  Administered 2015-07-14 – 2015-07-15 (×3): 50 ug via INTRAVENOUS
  Filled 2015-07-14: qty 2

## 2015-07-14 MED ORDER — BISACODYL 10 MG RE SUPP: 10.0000 mg | Freq: Every day | RECTAL | Status: DC | PRN

## 2015-07-14 MED ORDER — FENTANYL CITRATE (PF) 100 MCG/2ML IJ SOLN
INTRAMUSCULAR | Status: DC | PRN
  Administered 2015-07-14 (×2): 50 ug via INTRAVENOUS

## 2015-07-14 MED ORDER — DEXAMETHASONE SODIUM PHOSPHATE 4 MG/ML IJ SOLN
INTRAMUSCULAR | Status: AC
  Filled 2015-07-14: qty 1

## 2015-07-14 MED ORDER — LABETALOL HCL 5 MG/ML IV SOLN: 10.0000 mg | INTRAVENOUS | Status: DC | PRN

## 2015-07-14 MED ORDER — PROTAMINE SULFATE 10 MG/ML IV SOLN
INTRAVENOUS | Status: DC | PRN
  Administered 2015-07-14: 50 mg via INTRAVENOUS

## 2015-07-14 MED ORDER — SODIUM CHLORIDE 0.9 % IJ SOLN
INTRAMUSCULAR | Status: AC
  Filled 2015-07-14: qty 10

## 2015-07-14 MED ORDER — LIDOCAINE HCL (CARDIAC) 20 MG/ML IV SOLN
INTRAVENOUS | Status: AC
  Filled 2015-07-14: qty 5

## 2015-07-14 MED ORDER — ANTISEPTIC ORAL RINSE SOLUTION (CORINZ)
7.0000 mL | Freq: Four times a day (QID) | OROMUCOSAL | Status: DC
  Administered 2015-07-15 (×4): 7 mL via OROMUCOSAL

## 2015-07-14 MED ORDER — HEPARIN SODIUM (PORCINE) 1000 UNIT/ML IJ SOLN
INTRAMUSCULAR | Status: AC
  Filled 2015-07-14: qty 1

## 2015-07-14 MED ORDER — ONDANSETRON HCL 4 MG/2ML IJ SOLN
INTRAMUSCULAR | Status: DC | PRN
  Administered 2015-07-14: 4 mg via INTRAVENOUS

## 2015-07-14 MED ORDER — DEXAMETHASONE SODIUM PHOSPHATE 4 MG/ML IJ SOLN
6.0000 mg | Freq: Once | INTRAMUSCULAR | Status: AC
  Administered 2015-07-14: 6 mg via INTRAVENOUS

## 2015-07-14 MED ORDER — LOSARTAN POTASSIUM 25 MG PO TABS
25.0000 mg | ORAL_TABLET | Freq: Every day | ORAL | Status: DC
  Administered 2015-07-15 – 2015-07-16 (×2): 25 mg via ORAL
  Filled 2015-07-14 (×2): qty 1

## 2015-07-14 MED ORDER — ALUM & MAG HYDROXIDE-SIMETH 200-200-20 MG/5ML PO SUSP: 15.0000 mL | ORAL | Status: DC | PRN

## 2015-07-14 MED ORDER — IPRATROPIUM-ALBUTEROL 0.5-2.5 (3) MG/3ML IN SOLN
3.0000 mL | Freq: Four times a day (QID) | RESPIRATORY_TRACT | Status: DC
  Administered 2015-07-14 – 2015-07-16 (×6): 3 mL via RESPIRATORY_TRACT
  Filled 2015-07-14 (×6): qty 3

## 2015-07-14 MED ORDER — NEOSTIGMINE METHYLSULFATE 10 MG/10ML IV SOLN
INTRAVENOUS | Status: AC
  Filled 2015-07-14: qty 1

## 2015-07-14 MED ORDER — PROPOFOL 1000 MG/100ML IV EMUL
INTRAVENOUS | Status: AC
  Administered 2015-07-14: 20 ug/kg/min via INTRAVENOUS
  Filled 2015-07-14: qty 100

## 2015-07-14 MED ORDER — FENTANYL CITRATE (PF) 100 MCG/2ML IJ SOLN: 25.0000 ug | INTRAMUSCULAR | Status: DC | PRN

## 2015-07-14 MED ORDER — GLYCOPYRROLATE 0.2 MG/ML IJ SOLN
INTRAMUSCULAR | Status: AC
  Filled 2015-07-14: qty 3

## 2015-07-14 MED ORDER — DEXTROSE 5 % IV SOLN
INTRAVENOUS | Status: AC
  Filled 2015-07-14: qty 1.5

## 2015-07-14 MED ORDER — DEXTROSE 5 % IV SOLN
1.5000 g | INTRAVENOUS | Status: AC
  Administered 2015-07-14: 1.5 g via INTRAVENOUS

## 2015-07-14 MED ORDER — ATORVASTATIN CALCIUM 20 MG PO TABS
20.0000 mg | ORAL_TABLET | Freq: Every day | ORAL | Status: DC
  Administered 2015-07-15 – 2015-07-16 (×2): 20 mg via ORAL
  Filled 2015-07-14 (×2): qty 1

## 2015-07-14 MED ORDER — ROCURONIUM BROMIDE 50 MG/5ML IV SOLN
INTRAVENOUS | Status: AC
  Filled 2015-07-14: qty 1

## 2015-07-14 MED ORDER — ONDANSETRON HCL 4 MG/2ML IJ SOLN: 4.0000 mg | Freq: Four times a day (QID) | INTRAMUSCULAR | Status: DC | PRN

## 2015-07-14 MED ORDER — RACEPINEPHRINE HCL 2.25 % IN NEBU
1.5000 mL | INHALATION_SOLUTION | Freq: Once | RESPIRATORY_TRACT | Status: AC
  Administered 2015-07-14: 1.5 mL via RESPIRATORY_TRACT
  Filled 2015-07-14: qty 1.5

## 2015-07-14 MED ORDER — SODIUM CHLORIDE 0.9 % IR SOLN
Status: DC | PRN
  Administered 2015-07-14: 09:00:00

## 2015-07-14 MED ORDER — ALBUMIN HUMAN 5 % IV SOLN
INTRAVENOUS | Status: AC
  Filled 2015-07-14: qty 250

## 2015-07-14 MED ORDER — SODIUM CHLORIDE 0.9 % IV SOLN: INTRAVENOUS | Status: DC

## 2015-07-14 MED ORDER — ONDANSETRON HCL 4 MG/2ML IJ SOLN
INTRAMUSCULAR | Status: AC
  Filled 2015-07-14: qty 2

## 2015-07-14 MED ORDER — NEOSTIGMINE METHYLSULFATE 10 MG/10ML IV SOLN
INTRAVENOUS | Status: DC | PRN
  Administered 2015-07-14: 3 mg via INTRAVENOUS

## 2015-07-14 MED ORDER — ENOXAPARIN SODIUM 40 MG/0.4ML ~~LOC~~ SOLN: 40.0000 mg | SUBCUTANEOUS | Status: DC

## 2015-07-14 MED ORDER — SENNOSIDES-DOCUSATE SODIUM 8.6-50 MG PO TABS
1.0000 | ORAL_TABLET | Freq: Every evening | ORAL | Status: DC | PRN
Start: 2015-07-14 — End: 2015-07-16

## 2015-07-14 MED ORDER — DEXMEDETOMIDINE HCL IN NACL 200 MCG/50ML IV SOLN
0.4000 ug/kg/h | INTRAVENOUS | Status: DC
  Filled 2015-07-14: qty 50

## 2015-07-14 MED ORDER — FLEET ENEMA 7-19 GM/118ML RE ENEM
1.0000 | ENEMA | Freq: Once | RECTAL | Status: DC | PRN
  Filled 2015-07-14: qty 1

## 2015-07-14 MED ORDER — FENTANYL CITRATE (PF) 100 MCG/2ML IJ SOLN
50.0000 ug | INTRAMUSCULAR | Status: AC | PRN
  Administered 2015-07-14 – 2015-07-15 (×3): 50 ug via INTRAVENOUS
  Filled 2015-07-14 (×3): qty 2

## 2015-07-14 MED ORDER — VITAMIN D3 1.25 MG (50000 UT) PO CAPS: 50000.0000 [IU] | ORAL_CAPSULE | ORAL | Status: DC

## 2015-07-14 MED ORDER — PROPOFOL 10 MG/ML IV BOLUS
INTRAVENOUS | Status: DC | PRN
  Administered 2015-07-14: 80 mg via INTRAVENOUS

## 2015-07-14 MED ORDER — METOPROLOL SUCCINATE ER 50 MG PO TB24
50.0000 mg | ORAL_TABLET | Freq: Every day | ORAL | Status: DC
  Administered 2015-07-15 – 2015-07-16 (×2): 50 mg via ORAL
  Filled 2015-07-14 (×3): qty 1

## 2015-07-14 MED ORDER — FAMOTIDINE 20 MG PO TABS: 20.0000 mg | ORAL_TABLET | Freq: Every day | ORAL | Status: DC | PRN

## 2015-07-14 MED ORDER — GUAIFENESIN-DM 100-10 MG/5ML PO SYRP: 15.0000 mL | ORAL_SOLUTION | ORAL | Status: DC | PRN

## 2015-07-14 MED ORDER — METOPROLOL TARTRATE 1 MG/ML IV SOLN: 2.0000 mg | INTRAVENOUS | Status: DC | PRN

## 2015-07-14 MED ORDER — GLYCOPYRROLATE 0.2 MG/ML IJ SOLN
INTRAMUSCULAR | Status: DC | PRN
  Administered 2015-07-14: 0.4 mg via INTRAVENOUS

## 2015-07-14 MED ORDER — ALBUMIN HUMAN 5 % IV SOLN
12.5000 g | Freq: Once | INTRAVENOUS | Status: AC
  Administered 2015-07-14: 12.5 g via INTRAVENOUS

## 2015-07-14 MED ORDER — DEXAMETHASONE SODIUM PHOSPHATE 4 MG/ML IJ SOLN
4.0000 mg | Freq: Two times a day (BID) | INTRAMUSCULAR | Status: DC
  Administered 2015-07-14: 4 mg via INTRAVENOUS
  Filled 2015-07-14 (×3): qty 1

## 2015-07-14 MED ORDER — TRAMADOL HCL 50 MG PO TABS: 50.0000 mg | ORAL_TABLET | Freq: Four times a day (QID) | ORAL | Status: DC | PRN

## 2015-07-14 MED ORDER — ACETAMINOPHEN 650 MG RE SUPP: 325.0000 mg | RECTAL | Status: DC | PRN

## 2015-07-14 MED ORDER — SUCCINYLCHOLINE CHLORIDE 20 MG/ML IJ SOLN
INTRAMUSCULAR | Status: AC
  Filled 2015-07-14: qty 1

## 2015-07-14 MED ORDER — ASPIRIN 81 MG PO CHEW
81.0000 mg | CHEWABLE_TABLET | Freq: Every day | ORAL | Status: DC
  Administered 2015-07-15 – 2015-07-16 (×2): 81 mg via ORAL
  Filled 2015-07-14 (×5): qty 1

## 2015-07-14 MED ORDER — PROPOFOL 10 MG/ML IV BOLUS
INTRAVENOUS | Status: AC
  Filled 2015-07-14: qty 20

## 2015-07-14 MED ORDER — THROMBIN 20000 UNITS EX KIT
PACK | CUTANEOUS | Status: DC | PRN
  Administered 2015-07-14: 10:00:00 via TOPICAL

## 2015-07-14 MED ORDER — CHLORHEXIDINE GLUCONATE 0.12% ORAL RINSE (MEDLINE KIT)
15.0000 mL | Freq: Two times a day (BID) | OROMUCOSAL | Status: DC
  Administered 2015-07-14 – 2015-07-15 (×2): 15 mL via OROMUCOSAL

## 2015-07-14 MED ORDER — LACTATED RINGERS IV SOLN
INTRAVENOUS | Status: DC | PRN
  Administered 2015-07-14: 08:00:00 via INTRAVENOUS

## 2015-07-14 MED ORDER — 0.9 % SODIUM CHLORIDE (POUR BTL) OPTIME
TOPICAL | Status: DC | PRN
  Administered 2015-07-14: 2000 mL

## 2015-07-14 MED ORDER — PHENYLEPHRINE HCL 10 MG/ML IJ SOLN
10.0000 mg | INTRAVENOUS | Status: DC | PRN
  Administered 2015-07-14: 20 ug/min via INTRAVENOUS

## 2015-07-14 MED ORDER — PROPOFOL INFUSION 10 MG/ML OPTIME
INTRAVENOUS | Status: DC | PRN
  Administered 2015-07-14: 25 ug/kg/min via INTRAVENOUS

## 2015-07-14 MED ORDER — LIDOCAINE HCL (CARDIAC) 20 MG/ML IV SOLN
INTRAVENOUS | Status: DC | PRN
  Administered 2015-07-14: 40 mg via INTRAVENOUS

## 2015-07-14 MED ORDER — MORPHINE SULFATE 2 MG/ML IJ SOLN
INTRAMUSCULAR | Status: AC
  Filled 2015-07-14: qty 1

## 2015-07-14 MED ORDER — FENTANYL CITRATE (PF) 250 MCG/5ML IJ SOLN
INTRAMUSCULAR | Status: AC
  Filled 2015-07-14: qty 5

## 2015-07-14 MED ORDER — PROTAMINE SULFATE 10 MG/ML IV SOLN
INTRAVENOUS | Status: AC
  Filled 2015-07-14: qty 5

## 2015-07-14 SURGICAL SUPPLY — 41 items
BAG DECANTER FOR FLEXI CONT (MISCELLANEOUS) ×3 IMPLANT
CANISTER SUCTION 2500CC (MISCELLANEOUS) ×3 IMPLANT
CATH ROBINSON RED A/P 18FR (CATHETERS) ×3 IMPLANT
CLIP TI MEDIUM 24 (CLIP) ×3 IMPLANT
CLIP TI WIDE RED SMALL 24 (CLIP) ×3 IMPLANT
CRADLE DONUT ADULT HEAD (MISCELLANEOUS) ×3 IMPLANT
DRAIN CHANNEL 15F RND FF W/TCR (WOUND CARE) IMPLANT
ELECT REM PT RETURN 9FT ADLT (ELECTROSURGICAL) ×3
ELECTRODE REM PT RTRN 9FT ADLT (ELECTROSURGICAL) ×1 IMPLANT
EVACUATOR SILICONE 100CC (DRAIN) IMPLANT
GAUZE SPONGE 4X4 12PLY STRL (GAUZE/BANDAGES/DRESSINGS) ×3 IMPLANT
GLOVE BIO SURGEON STRL SZ7 (GLOVE) ×3 IMPLANT
GLOVE BIOGEL PI IND STRL 7.5 (GLOVE) ×1 IMPLANT
GLOVE BIOGEL PI INDICATOR 7.5 (GLOVE) ×2
GOWN STRL REUS W/ TWL LRG LVL3 (GOWN DISPOSABLE) ×3 IMPLANT
GOWN STRL REUS W/TWL LRG LVL3 (GOWN DISPOSABLE) ×6
INSERT FOGARTY SM (MISCELLANEOUS) ×6 IMPLANT
KIT BASIN OR (CUSTOM PROCEDURE TRAY) ×3 IMPLANT
KIT ROOM TURNOVER OR (KITS) ×3 IMPLANT
LIQUID BAND (GAUZE/BANDAGES/DRESSINGS) ×3 IMPLANT
NS IRRIG 1000ML POUR BTL (IV SOLUTION) ×6 IMPLANT
PACK CAROTID (CUSTOM PROCEDURE TRAY) ×3 IMPLANT
PAD ARMBOARD 7.5X6 YLW CONV (MISCELLANEOUS) ×6 IMPLANT
PUNCH AORTIC ROTATE 5MM 8IN (MISCELLANEOUS) ×3 IMPLANT
SHUNT CAROTID BYPASS 10 (VASCULAR PRODUCTS) IMPLANT
SHUNT CAROTID BYPASS 12 (VASCULAR PRODUCTS) IMPLANT
SPONGE INTESTINAL PEANUT (DISPOSABLE) ×3 IMPLANT
SPONGE SURGIFOAM ABS GEL 100 (HEMOSTASIS) IMPLANT
SUT ETHILON 3 0 PS 1 (SUTURE) IMPLANT
SUT MNCRL AB 4-0 PS2 18 (SUTURE) ×3 IMPLANT
SUT PROLENE 5 0 C 1 24 (SUTURE) ×3 IMPLANT
SUT PROLENE 6 0 BV (SUTURE) ×3 IMPLANT
SUT PROLENE 6 0 CC (SUTURE) ×9 IMPLANT
SUT PROLENE 7 0 BV 1 (SUTURE) IMPLANT
SUT VIC AB 2-0 CT1 27 (SUTURE) ×2
SUT VIC AB 2-0 CT1 TAPERPNT 27 (SUTURE) ×1 IMPLANT
SUT VIC AB 3-0 SH 27 (SUTURE) ×2
SUT VIC AB 3-0 SH 27XBRD (SUTURE) ×1 IMPLANT
SYR TB 1ML LUER SLIP (SYRINGE) IMPLANT
TRAY FOLEY W/METER SILVER 16FR (SET/KITS/TRAYS/PACK) ×3 IMPLANT
WATER STERILE IRR 1000ML POUR (IV SOLUTION) ×3 IMPLANT

## 2015-07-14 NOTE — Op Note (Signed)
OPERATIVE NOTE   PROCEDURE: 1. Left subclavian artery to left common carotid artery transposition  PRE-OPERATIVE DIAGNOSIS: penetrating aortic ulcer in thoracic aorta  POST-OPERATIVE DIAGNOSIS: same as above   SURGEON: Adele Barthel, MD  ASSISTANT(S): Dr. Kellie Simmering  ANESTHESIA: general  ESTIMATED BLOOD LOSS: 50 cc  FINDING(S): 1.  Diseased subclavian artery  2.  Soft, disease free subclavian artery  3.  Normal flow signatures in subclavian artery, common carotid artery, vertebral artery, and left inferior mammary artery  SPECIMEN(S):  none  INDICATIONS:   Lindsay Park is a 81 y.o. female who presents with symptomatic thoracic aortic penetrating ulcers.  This patient has a known Class 1 thoracoabdominal aortic aneurysm.  I discussed with this patient repairing the thoracic component to treat the chest/back pains and then considering the aortic segment if needed in the future.  In order to facilitate this, this patient needed a left subclavian artery to left common carotid artery transposition to facilitate placement of the thoracic endograft.  I discussed with the patient the risks, benefits, and alternatives to subclavian to carotid transposition vs bypass.  I discussed the procedural details of with the patient.  The patient is aware that the risks of these procedures include but are not limited to: bleeding, infection, stroke, myocardial infarction, death, cranial nerve injuries both temporary and permanent, neck hematoma, possible airway compromise, labile blood pressure post-operatively, cerebral hyperperfusion syndrome, thoracic duct injury, phrenic nerve injury, and possible need for additional interventions in the future. The patient is aware of the risks and agrees to proceed forward with the procedure.   DESCRIPTION: After obtaining full informed written consent, the patient was brought back to the operating room and placed supine upon the operating table.  The patient received  IV antibiotics prior to induction.  After obtaining adequate anesthesia, the patient was prepped and draped in the standard fashion for: left carotid to subclavian transposition versus bypass.  I made a incision over the sternocleidomastoid muscle one finger width above the clavicle.  I transected the platysma muscle with electrocautery and then transected the clavicular and sternal heads of the sternocleidomastoid.  I then dissected out the internal jugular vein and retracted this laterally.  I then dissected out the left common carotid artery until I had a mobile segment.   I was then able to identify the vagus nerve.  I placed a vessel loop around the vagus nerve and internal jugular vein.  This allowed gentle lateral retraction of both.  In this process, I found the thoracic duct.  This was tied off with 2-0 silks and transected.  I then dissected down to the proximal subclavian artery.  I dissected into the superior mediastinal extent.  I then dissected distally the subclavian artery to the vertebral artery and left inferior mammary artery.  The patient was given 4000 units of Heparin intravenously, which was a therapeutic bolus.  After 3 minutes, I clamped the subclavian artery with a Fogarty clamp and then clamped the proximal subclavian artery with a Henley clamp.  I transected the subclavian artery 1-2 cm proximal to the vertebral artery.  I oversewed the proximal subclavian artery with a double layer of 5-0 Prolene.  I then marked the appropriate location on the left common carotid artery for the transposition.   I clamped the left common carotid artery and rotated the artery ~45-90 degrees with the clamps.  I made an arteriotomy with a 11-blade and extended it slightly with a Potts scissor.  I placed a 5  Aortic punch into the arteriotomy and made a hole for the transposition.  I sewed the distal subclavian artery to the common carotid artery with a running stitch of 6-0 Prolene.  I pulled up on the  posterior wall suture line with a nerve hook prior to running the stitches on the anterior surface.  This was difficult due to disease in the left subclavian artery.  Prior to completing this anastomosis, all artery were bled.  No thrombus was noted.  I repaired a few bleedings areas with interrupted stitch of 6-0 Prolene.  The patient was given 50 mg of Protamine and then packed thrombin and gelfoam in the incision.  After a few minutes, the bleeding stopped.  I washed out the wound and packed another round of thrombin and gelfoam.  I removed the gelfoam and no bleeding was present.  I reapproximated the sternocleidomastoid muscle bellies with 2-0 Vicryl.  I then reapproximated the platysma muscle with a running stitch of 3-0 Vicryl.  The skin was then reapproximated with a running subcuticular of 4-0 Monocryl.  The skin was cleaned, dried, and reinforced with Dermabond.   COMPLICATIONS: none  CONDITION: stable   Adele Barthel, MD [facility] Office: [phone] Pager: [phone]  07/14/2015, 10:46 AM

## 2015-07-14 NOTE — Transfer of Care (Addendum)
Immediate Anesthesia Transfer of Care Note  Patient: Lindsay Park  Procedure(s) Performed: Procedure(s): LEFT SUBCLAVIAN to CAROTID ARTERY TRANSPOSITION  (Left)  Patient Location: PACU  Anesthesia Type:General  Level of Consciousness: awake, alert , sedated and patient cooperative  Airway & Oxygen Therapy: Patient Spontanous Breathing and Patient connected to nasal cannula oxygen  Patient developing increased respiratory difficulty.  Voices difficulty breathing.  Dr. Jillyn Hidden @ bedside.  Assisted breathing with 100 % O2.  Eventually sedated and intubated.  Placed on ventilator, CXR and Precedex gtt.  Post-op Assessment: Report given to RN, Post -op Vital signs reviewed and stable and Patient moving all extremities  Post vital signs: Reviewed and stable  Last Vitals:  Filed Vitals:   07/14/15 0644  BP: 131/78  Pulse: 71  Temp: 36.4 C  Resp: 18    Complications: No apparent anesthesia complications

## 2015-07-14 NOTE — Progress Notes (Signed)
Pt transported from PACU to 2S14 with PACU RN and RT with no apparent complications.. Pt is settled in room and appears stable at this time. RT will continue to monitor.

## 2015-07-14 NOTE — Consult Note (Signed)
PULMONARY / CRITICAL CARE MEDICINE   Name: Lindsay Park MRN: [id number] DOB: 1929/02/19    ADMISSION DATE:  07/14/2015 CONSULTATION DATE:  8/10  REFERRING MD :  Bridgett Larsson   CHIEF COMPLAINT:  Post op respiratory failure, ?upper airway compromise   INITIAL PRESENTATION: 81yo female with hx CHF, COPD, HTN, thoracic AAA admitted 8/10 for elective vascular surgery as part of staging process to repair/stent aneurysm.  Post op was reversed and extubated, then developed dyspnea, confusion, respiratory failure and was re intubated. PCCM called to admit.   STUDIES:    SIGNIFICANT EVENTS: 8/10>> OR, (Left subclavian artery to left common carotid artery transposition), post op resp failure. ?upper airway compromise>reintubated    HISTORY OF PRESENT ILLNESS:  81yo female with hx CHF, COPD, HTN, thoracic AAA admitted 8/10 for elective vascular surgery as part of staging process to repair/stent aneurysm.  Post op was reversed and extubated, then developed dyspnea, confusion, respiratory failure and was re intubated. PCCM called to admit.   Of note - preop was very hoarse but no dyspnea.   PAST MEDICAL HISTORY :   has a past medical history of Hypercholesteremia; CHF (congestive heart failure); Degenerative disc disease, lumbar; Anemia; COPD (chronic obstructive pulmonary disease); PONV (postoperative nausea and vomiting); Shortness of breath dyspnea; Pneumonia (2015); GERD (gastroesophageal reflux disease); Hypothyroidism; and Hypertension.  has past surgical history that includes Abdominal hysterectomy; Stomach surgery; Thyroidectomy (1983); Cardiac catheterization; and Eye surgery. Prior to Admission medications   Medication Sig Start Date End Date Taking? Authorizing Provider  aspirin 81 MG tablet Take 81 mg by mouth daily.   Yes Historical Provider, MD  atorvastatin (LIPITOR) 20 MG tablet Take 20 mg by mouth daily.   Yes Historical Provider, MD  Cholecalciferol (VITAMIN D3) 50000 UNITS CAPS Take 50,000  Units by mouth once a week. On Sunday.   Yes Historical Provider, MD  famotidine (PEPCID) 20 MG tablet Take 20 mg by mouth daily as needed for heartburn or indigestion.    Yes Historical Provider, MD  levothyroxine (SYNTHROID, LEVOTHROID) 100 MCG tablet Take 100 mcg by mouth daily before breakfast.   Yes Historical Provider, MD  losartan (COZAAR) 25 MG tablet Take 25 mg by mouth daily.   Yes Historical Provider, MD  metoprolol succinate (TOPROL-XL) 50 MG 24 hr tablet Take 50 mg by mouth daily. Take with or immediately following a meal.   Yes Historical Provider, MD  traMADol (ULTRAM) 50 MG tablet Take 50 mg by mouth every 12 (twelve) hours as needed for moderate pain.    Yes Historical Provider, MD   Allergies  Allergen Reactions  . Enalapril Other (See Comments)    Tongue swelling  . Latex Itching  . Penicillins Other (See Comments)    Unknown    FAMILY HISTORY:  has no family status information on file.  SOCIAL HISTORY:  reports that she has quit smoking. Her smoking use included Cigarettes. She quit after 30 years of use. She has never used smokeless tobacco. She reports that she does not drink alcohol or use illicit drugs.  REVIEW OF SYSTEMS:  Unable.    SUBJECTIVE:   VITAL SIGNS: Temp:  [97.1 F (36.2 C)-97.5 F (36.4 C)] 97.1 F (36.2 C) (08/10 1059) Pulse Rate:  [55-105] 55 (08/10 1145) Resp:  [15-18] 15 (08/10 1145) BP: (108-195)/(42-112) 108/42 mmHg (08/10 1145) SpO2:  [94 %-100 %] 99 % (08/10 1145) Arterial Line BP: (200)/(109) 200/109 mmHg (08/10 1113) FiO2 (%):  [35 %] 35 % (08/10 1145) HEMODYNAMICS:  VENTILATOR SETTINGS: Vent Mode:  [-] PRVC FiO2 (%):  [35 %] 35 % Set Rate:  [15 bmp] 15 bmp Vt Set:  [370 mL] 370 mL PEEP:  [5 cmH20] 5 cmH20 Plateau Pressure:  [13 cmH20] 13 cmH20 INTAKE / OUTPUT:  Intake/Output Summary (Last 24 hours) at 07/14/15 1159 Last data filed at 07/14/15 1032  Gross per 24 hour  Intake   1150 ml  Output    100 ml  Net   1050 ml     PHYSICAL EXAMINATION: General:  Thin, frail, female, NAD  Neuro:  Sedated on vent post reintubation  HEENT:  Mm moist, no JVD, no visible hematoma, small incision c/d  Cardiovascular:  s1s2 rrr, brady Lungs:  resps even non labored on vent, good bilat breath sounds, essentially clear  Abdomen:  Round, soft, +bs  Musculoskeletal:  Warm and dry, no edema    LABS:  CBC  Recent Labs Lab 07/08/15 1532  WBC 8.7  HGB 13.9  HCT 42.6  PLT 404*   Coag's  Recent Labs Lab 07/13/15 1031  APTT 39*  INR 1.17   BMET  Recent Labs Lab 07/08/15 1532  NA 137  K 4.0  CL 99*  CO2 27  BUN 11  CREATININE 1.14*  GLUCOSE 154*   Electrolytes  Recent Labs Lab 07/08/15 1532  CALCIUM 9.3   Sepsis Markers No results for input(s): LATICACIDVEN, PROCALCITON, O2SATVEN in the last 168 hours. ABG No results for input(s): PHART, PCO2ART, PO2ART in the last 168 hours. Liver Enzymes  Recent Labs Lab 07/08/15 1532  AST 21  ALT 8*  ALKPHOS 120  BILITOT 0.3  ALBUMIN 3.5   Cardiac Enzymes No results for input(s): TROPONINI, PROBNP in the last 168 hours. Glucose No results for input(s): GLUCAP in the last 168 hours.  Imaging Dg Chest Port 1 View  07/14/2015   CLINICAL DATA:  Short of breath.  EXAM: PORTABLE CHEST - 1 VIEW  COMPARISON:  CT chest 06/10/2015  FINDINGS: Endotracheal tube 3 cm above the carina. Lungs are well aerated. Negative for pneumonia or heart failure. No effusion  Thoracic aortic aneurysm as noted on prior chest CT.  IMPRESSION: No acute abnormality.  Thoracic aortic aneurysm  Endotracheal tube in good position.   Electronically Signed   By: Franchot Gallo M.D.   On: 07/14/2015 11:49     ASSESSMENT / PLAN:  PULMONARY OETT 8/10 >> A: Postop respiratory failure -unclear reason,? Residual anesthesia vs upper airway issues -there does not appear to be vagal injury or diaphragmatic paralysis COPD P:   Empiric steroids for laryngeal edema We will assess cuff  leak again in 24 hours and plan for extubation Duonebs q 6h  CARDIOVASCULAR  A: Thoracic AAA status post subclavian transposition Preop stress test was intermediate risk Echo 06/2015-moderate TR P:  Postop care per vascular Cycle troponins  RENAL A:  No issues P:     GASTROINTESTINAL A:  No issues P:     ENDOCRINE A:  No issues   P:   CBGs while npo  NEUROLOGIC A:  Sedation pain P:   RASS goal: 0 Fent prn   FAMILY  - Updates: per VVS  - Inter-disciplinary family meet or Palliative Care meeting due by:  NA  The patient is critically ill with multiple organ systems failure and requires high complexity decision making for assessment and support, frequent evaluation and titration of therapies, application of advanced monitoring technologies and extensive interpretation of multiple databases. Critical Care Time devoted  to patient care services described in this note independent of APP time is 35 minutes.    Kara Mead MD. [facility] Flood. [facility] Pager [phone] If no response call [phone]    07/14/2015  12:00 PM

## 2015-07-14 NOTE — Progress Notes (Signed)
Dr. Bridgett Larsson returned call from pharmacist re: prophylactic abx.  States, "pt allergic to pcn.  We will not do any prophylactic abx with this case."

## 2015-07-14 NOTE — Anesthesia Procedure Notes (Signed)
Performed by: Shira Bobst       

## 2015-07-14 NOTE — H&P (View-Only) (Signed)
Established Abdominal Aortic Aneurysm  History of Present Illness  The patient is a 81 y.o. ([birth year]) female who presents with chief complaint: follow up from cardiac evaluation.  Previous studies demonstrate a Crawford 1 TAAA with likely sx thoracic PAU.  I sent the patient for cardiac evaluation prior to considering a TEVAR coverage of the thoracic PAU.  Her cardiologist has cleared her to proceed with repair.  The patient continues to have intermittent L back pain requiring intermittent narcotic use.  Past Medical History  Diagnosis Date  . Hypertension   . Hypercholesteremia   . Thyroid disease   . CHF (congestive heart failure)   . Degenerative disc disease, lumbar   . Cancer   . Anemia   . COPD (chronic obstructive pulmonary disease)     Past Surgical History  Procedure Laterality Date  . Abdominal hysterectomy    . Thyroidectomy    . Stomach surgery      History   Social History  . Marital Status: Single    Spouse Name: N/A  . Number of Children: N/A  . Years of Education: N/A   Occupational History  . Not on file.   Social History Main Topics  . Smoking status: Former Smoker -- 30 years    Types: Cigarettes  . Smokeless tobacco: Never Used  . Alcohol Use: No  . Drug Use: No  . Sexual Activity: Not on file   Other Topics Concern  . Not on file   Social History Narrative    Family History  Problem Relation Age of Onset  . Heart disease Sister     before age 76  . Deep vein thrombosis Daughter   . COPD Daughter   . Hyperlipidemia Daughter   . Hypertension Daughter   . COPD Son   . Hyperlipidemia Son   . Hypertension Son      Current Outpatient Prescriptions  Medication Sig Dispense Refill  . aspirin 81 MG tablet Take 81 mg by mouth daily.    Marland Kitchen atorvastatin (LIPITOR) 20 MG tablet Take 20 mg by mouth daily.    . Cholecalciferol (VITAMIN D3) 50000 UNITS CAPS Take 1 capsule by mouth once a week.    . famotidine (PEPCID) 20 MG tablet Take 20  mg by mouth 2 (two) times daily.    Marland Kitchen levothyroxine (SYNTHROID, LEVOTHROID) 100 MCG tablet Take 100 mcg by mouth daily before breakfast.    . losartan (COZAAR) 25 MG tablet Take 25 mg by mouth daily.    . metoprolol succinate (TOPROL-XL) 50 MG 24 hr tablet Take 50 mg by mouth daily. Take with or immediately following a meal.    . traMADol (ULTRAM) 50 MG tablet Take by mouth every 12 (twelve) hours as needed.    . predniSONE (DELTASONE) 20 MG tablet      No current facility-administered medications for this visit.     Allergies  Allergen Reactions  . Enalapril     Tongue swelling      REVIEW OF SYSTEMS: (Positives checked otherwise negative)  CARDIOVASCULAR:  [x]  chest pain,  [x]  chest pressure,  [x]  palpitations,  [x]  shortness of breath when laying flat,  [x]  shortness of breath with exertion,  [ ]  pain in feet when walking,  [ ]  pain in feet when laying flat, [ ]  history of blood clot in veins (DVT),  [ ]  history of phlebitis,  [ ]  swelling in legs,  [ ]  varicose veins  PULMONARY:  [ ]  productive cough,  [ ]   asthma,  [ ]  wheezing  NEUROLOGIC:  [x]  weakness in arms or legs,  [x]  numbness in arms or legs,  [x]  raspy voice since thyroidectomy, worsened over 6 months [ ]  difficulty speaking or slurred speech,  [ ]  temporary loss of vision in one eye,  [ ]  dizziness  HEMATOLOGIC:  [ ]  bleeding problems,  [ ]  problems with blood clotting too easily  MUSCULOSKEL:  [ ]  joint pain, [ ]  joint swelling  GASTROINTEST:  [ ]  vomiting blood,  [ ]  blood in stool   GENITOURINARY:  [ ]  burning with urination,  [ ]  blood in urine  PSYCHIATRIC:  [ ]  history of major depression  INTEGUMENTARY:  [ ]  rashes,  [ ]  ulcers  CONSTITUTIONAL:  [x]  fever,  [ ]  chills   For VQI Use Only   PRE-ADM LIVING: Home  AMB STATUS: Ambulatory  CAD Sx: None  PRIOR CHF: Mild  STRESS TEST: [x]  No, [ ]  Normal, [ ]  +  ischemia, [ ]  + MI, [ ]  Both    Physical Examination  Filed Vitals:   07/02/15 1128  BP: 125/82  Pulse: 76  Temp: 98.2 F (36.8 C)  Resp: 18  Height: 5' (1.524 m)  Weight: 93 lb (42.185 kg)  SpO2: 99%   Body mass index is 18.16 kg/(m^2).  General: A&O x 3, WD, Cachectic, raspy voice  Pulmonary: Sym exp, good air movt, CTAB, no rales, rhonchi, & wheezing  Cardiac: RRR, Nl S1, S2, no Murmurs, rubs or gallops  Vascular: Vessel Right Left  Radial Palpable Palpable  Brachial Palpable Palpable  Carotid Palpable, without bruit Palpable, without bruit  Aorta Not palpable N/A  Femoral Palpable Palpable  Popliteal Not palpable Not palpable  PT Palpable Palpable  DP Palpable Palpable   Gastrointestinal: soft, NTND, no G/R, no HSM, no masses, no CVAT B  Musculoskeletal: M/S 5/5 throughout , Extremities without ischemic changes   Neurologic:  Pain and light touch intact in extremities , Motor exam as listed above  Medical Decision Making  The patient is a 81 y.o. female who presents with: Crawford Type 1 TAAA with sx thoracic penetrating aortic ulcer, chronic HF   I have reviewed her chest CTA with my [employer] rep and he feels there is inadequate proximal neck to place a TEVAR without covering the L SCA.  I discussed the implications with the patient and daughter.  To facilitate, TEVAR coverage the thoracic PAU, a L SCA to CCA transposition or bypass would be necessary.  This patient has a bovine arch, so either procedure would give [place] an additional 4-5 cm of seal.  I would stage this procedure, doing the transposition or bypass then proceeding in 2-3 weeks with the TEVAR if there were no complications with the first procedure. I discussed with the patient the risks, benefits, and alternatives to subclavian to carotid transposition or bypass. I discussed the procedural details of the procedure with the patient.   The patient is aware that the risks of the procedure include but are  not limited to: bleeding, infection, stroke, myocardial infarction, death, cranial nerve injuries both temporary and permanent, neck hematoma, possible airway compromise, phrenic nerve injury, and thoracic duct injury, and possible need for additional interventions in the future.  The patient is aware of the risks and agrees to proceed forward with the procedure. I will discuss the thoracic endograft repair of the thoracic PAU upon follow-up to avoid confusing the patient.    Adele Barthel, MD [facility] Office: [phone] Pager: [phone]  07/02/2015, 12:48 PM

## 2015-07-14 NOTE — Progress Notes (Signed)
eLink Physician-Brief Progress Note Patient Name: Lindsay Park DOB: [birth year] MRN: [id number]   Date of Service  07/14/2015  HPI/Events of Note  Called to request sedation for mechanically ventilated patient and order for OGT.  eICU Interventions  Will order: 1. Propofol IV infusion.  2. Place OGT.      Intervention Category Minor Interventions: Agitation / anxiety - evaluation and management  Silver Achey Eugene 07/14/2015, 5:11 PM

## 2015-07-14 NOTE — Progress Notes (Signed)
   Daily Progress Note  Pt reintubated by Anesthesia due to airway issues, felt to be upper airway.  On exam currently, symmetric breath sounds with equal lung inflation.  No evidence of bleeding from surgical site.  No hematoma evident.  No tracheal deviation.  Racemic epinephrine and steroids already given.  Will transfer patient to SICU for hopefully ventilator went.  PCCM has already been contacted.  Adele Barthel, MD [facility] Office: [phone] Pager: [phone]  07/14/2015, 11:40 AM

## 2015-07-14 NOTE — Anesthesia Postprocedure Evaluation (Addendum)
  Anesthesia Post-op Note  Patient: Lindsay Park  Procedure(s) Performed: Procedure(s) (LRB): LEFT SUBCLAVIAN to CAROTID ARTERY TRANSPOSITION  (Left)  Patient Location: PACU  Anesthesia Type: General  Level of Consciousness: sedated  Airway and Oxygen Therapy: Ventilated with minimal vent settings  Post-op Pain: none  Post-op Assessment: Post-op Vital signs reviewed, Patient's Cardiovascular Status Stable, Respiratory Function Stable, Patent Airway and No signs of Nausea or vomiting  Last Vitals:  Filed Vitals:   07/14/15 1200  BP:   Pulse: 52  Temp:   Resp: 15   BP 165/80 Post-op Vital Signs: stable   Complications: Patient was extubated in OR with stable hemodynamics and met all clinical criteria for extubation. Upon entering pacu she became agitated and voiced difficulty breathing. Difficult to ascultate bilateral breath sounds initially with transmitted rhonci in bilateral lungs. Differential diagnosis is subglottic stenosis from edema or undiagnosed tracheomalacia. Decadron 6mg  (for potential airway edema, total of 10mg  now given) , morphine 1mg , precedex 10mcg, fent 22mcg (each titrated over span of 20 minutes, given separately) given for agitation, sedation, air hunger symptoms. Her strength on extubation intraoperatively was full, no pneumothorax currently on chest xray, bilateral breath sounds with no bronchospasm after intubation. The upper airway obstruction was relieved with re-intubation and there were no issues. Sedation was commenced with precedex as needed, phenylephrine for BP management, 250 albumin given for blood pressure support.

## 2015-07-14 NOTE — Interval H&P Note (Signed)
History and Physical Interval Note:  07/14/2015 8:33 AM  Lindsay Park  has presented today for surgery, with the diagnosis of Thoracic Penatrating Aortic Ulcer I71.2  The various methods of treatment have been discussed with the patient and family. After consideration of risks, benefits and other options for treatment, the patient has consented to  Procedure(s): LEFT SUBCLAVIAN - CAROTID ARTERY TRANSPOSITION VS  BYPASS GRAFT  (Left) as a surgical intervention .  The patient's history has been reviewed, patient examined, no change in status, stable for surgery.  I have reviewed the patient's chart and labs.  Questions were answered to the patient's satisfaction.     Adele Barthel

## 2015-07-14 NOTE — Anesthesia Preprocedure Evaluation (Addendum)
Anesthesia Evaluation  Patient identified by MRN, date of birth, ID band Patient awake    History of Anesthesia Complications (+) PONV  Airway Mallampati: I       Dental  (+) Missing, Poor Dentition   Pulmonary shortness of breath and with exertion, pneumonia -, resolved, COPDformer smoker,    + wheezing + stridor     Cardiovascular hypertension, Pt. on home beta blockers + Peripheral Vascular Disease and +CHF Rhythm:Regular Rate:Normal     Neuro/Psych negative neurological ROS     GI/Hepatic Neg liver ROS, GERD-  ,  Endo/Other  Hypothyroidism   Renal/GU negative Renal ROS     Musculoskeletal   Abdominal Normal abdominal exam  (+)  Abdomen: soft.    Peds  Hematology   Anesthesia Other Findings   Reproductive/Obstetrics                            Anesthesia Physical Anesthesia Plan  ASA: IV  Anesthesia Plan: General   Post-op Pain Management:    Induction: Intravenous  Airway Management Planned: Oral ETT  Additional Equipment: Arterial line  Intra-op Plan:   Post-operative Plan: Extubation in OR  Informed Consent: I have reviewed the patients History and Physical, chart, labs and discussed the procedure including the risks, benefits and alternatives for the proposed anesthesia with the patient or authorized representative who has indicated his/her understanding and acceptance.   Dental advisory given  Plan Discussed with: CRNA, Anesthesiologist and Surgeon  Anesthesia Plan Comments:         Anesthesia Quick Evaluation

## 2015-07-15 ENCOUNTER — Inpatient Hospital Stay (HOSPITAL_COMMUNITY): Payer: Medicare (Managed Care)

## 2015-07-15 ENCOUNTER — Encounter (HOSPITAL_COMMUNITY): Payer: Self-pay | Admitting: Vascular Surgery

## 2015-07-15 DIAGNOSIS — I712 Thoracic aortic aneurysm, without rupture: Principal | ICD-10-CM

## 2015-07-15 DIAGNOSIS — J432 Centrilobular emphysema: Secondary | ICD-10-CM

## 2015-07-15 LAB — TYPE AND SCREEN
ABO/RH(D): O POS
ANTIBODY SCREEN: NEGATIVE
UNIT DIVISION: 0
Unit division: 0

## 2015-07-15 LAB — CBC
HEMATOCRIT: 33.6 % — AB (ref 36.0–46.0)
Hemoglobin: 11.1 g/dL — ABNORMAL LOW (ref 12.0–15.0)
MCH: 26.6 pg (ref 26.0–34.0)
MCHC: 33 g/dL (ref 30.0–36.0)
MCV: 80.6 fL (ref 78.0–100.0)
PLATELETS: 412 10*3/uL — AB (ref 150–400)
RBC: 4.17 MIL/uL (ref 3.87–5.11)
RDW: 17.8 % — AB (ref 11.5–15.5)
WBC: 12.4 10*3/uL — AB (ref 4.0–10.5)

## 2015-07-15 LAB — BASIC METABOLIC PANEL
ANION GAP: 13 (ref 5–15)
BUN: 9 mg/dL (ref 6–20)
CALCIUM: 8.3 mg/dL — AB (ref 8.9–10.3)
CO2: 25 mmol/L (ref 22–32)
Chloride: 101 mmol/L (ref 101–111)
Creatinine, Ser: 0.94 mg/dL (ref 0.44–1.00)
GFR calc non Af Amer: 54 mL/min — ABNORMAL LOW (ref >60)
GLUCOSE: 123 mg/dL — AB (ref 65–99)
Potassium: 3.2 mmol/L — ABNORMAL LOW (ref 3.5–5.1)
SODIUM: 139 mmol/L (ref 135–145)

## 2015-07-15 MED ORDER — POTASSIUM CHLORIDE 10 MEQ/100ML IV SOLN
10.0000 meq | INTRAVENOUS | Status: DC
  Administered 2015-07-15: 10 meq via INTRAVENOUS
  Filled 2015-07-15 (×3): qty 100

## 2015-07-15 NOTE — Clinical Documentation Improvement (Addendum)
If known or able to determine, please document the acuity and type of CHF monitored and treated this admission in the progress notes and discharge summary.  Clinical Information: "CHF" is documented in the H&P.   The patient's cardiologist has cleared her to proceed with staged repair of her abdominal aortic aneurysm per the H&P. Home medications include, Cozaar and Toprol, which are continued here in the hospital.   [place] Documentation Specialist [place]

## 2015-07-15 NOTE — Progress Notes (Signed)
PULMONARY / CRITICAL CARE MEDICINE   Name: Lindsay Park MRN: [id number] DOB: 11-12-1929    ADMISSION DATE:  07/14/2015 CONSULTATION DATE:  8/10  REFERRING MD :  Bridgett Larsson   CHIEF COMPLAINT:  Post op respiratory failure, ?upper airway compromise   INITIAL PRESENTATION: 81yo female with hx CHF, COPD, HTN, thoracic AAA admitted 8/10 for elective vascular surgery as part of staging process to repair/stent aneurysm.  Post op was reversed and extubated, then developed dyspnea, confusion, respiratory failure and was re intubated. PCCM called to admit.   Of note - preop was very hoarse but no dyspnea.  STUDIES:    SIGNIFICANT EVENTS: 8/10>> OR, (Left subclavian artery to left common carotid artery transposition), post op resp failure. ?upper airway compromise>reintubated    SUBJECTIVE:  Afebrile Breathing ok Agitation, wants ETT out  VITAL SIGNS: Temp:  [97.1 F (36.2 C)-99.4 F (37.4 C)] 99.4 F (37.4 C) (08/11 0343) Pulse Rate:  [45-105] 64 (08/11 0700) Resp:  [13-30] 15 (08/11 0700) BP: (85-195)/(42-112) 102/68 mmHg (08/11 0600) SpO2:  [89 %-100 %] 99 % (08/11 0750) Arterial Line BP: (61-200)/(33-109) 105/51 mmHg (08/11 0700) FiO2 (%):  [35 %-40 %] 40 % (08/11 0750) Weight:  [86 lb 6.7 oz (39.2 kg)] 86 lb 6.7 oz (39.2 kg) (08/11 0600) HEMODYNAMICS:   VENTILATOR SETTINGS: Vent Mode:  [-] PRVC FiO2 (%):  [35 %-40 %] 40 % Set Rate:  [15 bmp] 15 bmp Vt Set:  [370 mL] 370 mL PEEP:  [5 cmH20] 5 cmH20 Plateau Pressure:  [7 cmH20-15 cmH20] 14 cmH20 INTAKE / OUTPUT:  Intake/Output Summary (Last 24 hours) at 07/15/15 0826 Last data filed at 07/15/15 0600  Gross per 24 hour  Intake 2088.16 ml  Output   3025 ml  Net -936.84 ml    PHYSICAL EXAMINATION: General:  Thin, frail, female, NAD  Neuro:  Alert, interactive, non focal HEENT:  Mm moist, no JVD, no visible hematoma, small incision c/d  Cardiovascular:  s1s2 rrr, brady Lungs:  resps even non labored on vent, good bilat  breath sounds, essentially clear , cuff leak + Abdomen:  Round, soft, +bs  Musculoskeletal:  Warm and dry, no edema    LABS:  CBC  Recent Labs Lab 07/08/15 1532 07/14/15 1824 07/15/15 0439  WBC 8.7 12.2* 12.4*  HGB 13.9 10.5* 11.1*  HCT 42.6 32.1* 33.6*  PLT 404* 329 412*   Coag's  Recent Labs Lab 07/13/15 1031  APTT 39*  INR 1.17   BMET  Recent Labs Lab 07/08/15 1532 07/14/15 1824 07/15/15 0439  NA 137  --  139  K 4.0  --  3.2*  CL 99*  --  101  CO2 27  --  25  BUN 11  --  9  CREATININE 1.14* 0.98 0.94  GLUCOSE 154*  --  123*   Electrolytes  Recent Labs Lab 07/08/15 1532 07/15/15 0439  CALCIUM 9.3 8.3*   Sepsis Markers No results for input(s): LATICACIDVEN, PROCALCITON, O2SATVEN in the last 168 hours. ABG No results for input(s): PHART, PCO2ART, PO2ART in the last 168 hours. Liver Enzymes  Recent Labs Lab 07/08/15 1532  AST 21  ALT 8*  ALKPHOS 120  BILITOT 0.3  ALBUMIN 3.5   Cardiac Enzymes No results for input(s): TROPONINI, PROBNP in the last 168 hours. Glucose No results for input(s): GLUCAP in the last 168 hours.  Imaging Dg Chest Portable 1 View  07/15/2015   CLINICAL DATA:  Respiratory failure.  EXAM: PORTABLE CHEST - 1 VIEW  COMPARISON:  07/14/2015.  FINDINGS: Endotracheal tube is in stable position. NG tube appears to be cold in the esophagus within side-port the gastroesophageal junction and tip in the stomach. Repositioning should be considered. Lungs are clear. Cardiomegaly. No pleural effusion or pneumothorax.  IMPRESSION: 1. Endotracheal tube in good anatomic position. Interim placement of NG tube. Midportion of the NG tube is coiled in the esophagus. Side-port is at the gastroesophageal junction, tip is within the stomach. Repositioning should be considered. 2. Stable cardiomegaly.  No acute pulmonary disease.  Critical Value/emergent results were called by telephone at the time of interpretation on 07/15/2015 at 8:02 am to nurse  Threasa Beards, who verbally acknowledged these results.   Electronically Signed   By: [place]   On: 07/15/2015 08:06   Dg Chest Port 1 View  07/14/2015   CLINICAL DATA:  Short of breath.  EXAM: PORTABLE CHEST - 1 VIEW  COMPARISON:  CT chest 06/10/2015  FINDINGS: Endotracheal tube 3 cm above the carina. Lungs are well aerated. Negative for pneumonia or heart failure. No effusion  Thoracic aortic aneurysm as noted on prior chest CT.  IMPRESSION: No acute abnormality.  Thoracic aortic aneurysm  Endotracheal tube in good position.   Electronically Signed   By: Franchot Gallo M.D.   On: 07/14/2015 11:49     ASSESSMENT / PLAN:  PULMONARY OETT 8/10 >> A: Postop respiratory failure -unclear reason,? Residual anesthesia vs upper airway issues -there does not appear to be vagal injury or diaphragmatic paralysis COPD P:   Dc - steroids for laryngeal edema Has cuff leak, proceed with  extubation Duonebs q 6h  CARDIOVASCULAR  A: Thoracic AAA status post subclavian transposition Preop stress test was intermediate risk Echo 06/2015-moderate TR Htn P:  Postop care per vascular Resume metoprolol , losartan   RENAL A: hypokalemia P:  replete   GASTROINTESTINAL A:  No issues P:  Advance PO   NEUROLOGIC A:  pain P:    Fent prn   FAMILY  - Updates: per VVS  - Inter-disciplinary family meet or Palliative Care meeting due by:  NA  Summary - extubated, no stridor, can mobilise & transfer out of ICU if no event sin 12h  The patient is critically ill with multiple organ systems failure and requires high complexity decision making for assessment and support, frequent evaluation and titration of therapies, application of advanced monitoring technologies and extensive interpretation of multiple databases. Critical Care Time devoted to patient care services described in this note independent of APP time is 31 minutes.    Kara Mead MD. [place]ade Flood. [facility] Pager [phone] If no response call [phone]    07/15/2015  8:26 AM

## 2015-07-15 NOTE — Progress Notes (Addendum)
  Progress Note    07/15/2015 7:56 AM 1 Day Post-Op  Subjective:  Intubated, but following commands  Tm 99.4  HR 50's-70's SB/NSR 73'Z-329'J systolic 24% .40 FiO2  Filed Vitals:   07/15/15 0700  BP:   Pulse: 64  Temp:   Resp: 15     Physical Exam: Neuro:  she is following commands. Incision:  C/d/i  CBC    Component Value Date/Time   WBC 12.4* 07/15/2015 0439   RBC 4.17 07/15/2015 0439   HGB 11.1* 07/15/2015 0439   HCT 33.6* 07/15/2015 0439   PLT 412* 07/15/2015 0439   MCV 80.6 07/15/2015 0439   MCH 26.6 07/15/2015 0439   MCHC 33.0 07/15/2015 0439   RDW 17.8* 07/15/2015 0439   LYMPHSABS 1.0 06/10/2015 1234   MONOABS 1.1* 06/10/2015 1234   EOSABS 0.1 06/10/2015 1234   BASOSABS 0.0 06/10/2015 1234    BMET    Component Value Date/Time   NA 139 07/15/2015 0439   K 3.2* 07/15/2015 0439   CL 101 07/15/2015 0439   CO2 25 07/15/2015 0439   GLUCOSE 123* 07/15/2015 0439   BUN 9 07/15/2015 0439   CREATININE 0.94 07/15/2015 0439   CALCIUM 8.3* 07/15/2015 0439   GFRNONAA 54* 07/15/2015 0439   GFRAA >60 07/15/2015 0439     Intake/Output Summary (Last 24 hours) at 07/15/15 0756 Last data filed at 07/15/15 0600  Gross per 24 hour  Intake 2088.16 ml  Output   3025 ml  Net -936.84 ml      Assessment/Plan:  This is a 81 y.o. female who is s/p Left subclavian artery to left common carotid artery transposition 1 Day Post-Op  -pt is coming off sedation for vent wean per CCM.  Hopefully able to extubate this morning -She is following commands -in 2-3 weeks, she will undergo a TEVAR    Leontine Locket, PA-C [facility] [phone]  Addendum  I have independently interviewed and examined the patient, and I agree with the physician assistant's findings.  Pt extubated.  BLL rales on pulm exam but resolution of upper airway sounds.  L neck without hematoma.  Inc c/d/i.  Palpable L radial pulse  Adele Barthel, MD [facility]  [place] Office: [phone] Pager: [phone]  07/15/2015, 8:55 AM

## 2015-07-15 NOTE — Procedures (Signed)
Extubation Procedure Note  Patient Details:   Name: Lindsay Park DOB: 02/01/1929 MRN: [id number]   Airway Documentation:     Evaluation  O2 sats: stable throughout Complications: No apparent complications Patient did tolerate procedure well. Bilateral Breath Sounds: Clear, Diminished Suctioning: Airway, Oral Yes  Patient tolerated wean. MD ordered to extubate. Positive for cuff leak. Patient extubated to a 3 Lpm nasal cannula. No signs of dyspnea or stridor. Patient instructed on the incentive spirometer achieving 750 mL, 5 times. RN at beside. Will continue to monitor.   [name] 07/15/2015, 8:28 AM

## 2015-07-15 NOTE — Progress Notes (Signed)
Utilization review completed.  

## 2015-07-16 ENCOUNTER — Ambulatory Visit: Payer: Medicare (Managed Care) | Admitting: Vascular Surgery

## 2015-07-16 LAB — CBC
HCT: 34.6 % — ABNORMAL LOW (ref 36.0–46.0)
Hemoglobin: 11.3 g/dL — ABNORMAL LOW (ref 12.0–15.0)
MCH: 26.8 pg (ref 26.0–34.0)
MCHC: 32.7 g/dL (ref 30.0–36.0)
MCV: 82 fL (ref 78.0–100.0)
PLATELETS: 405 10*3/uL — AB (ref 150–400)
RBC: 4.22 MIL/uL (ref 3.87–5.11)
RDW: 18.3 % — ABNORMAL HIGH (ref 11.5–15.5)
WBC: 10.1 10*3/uL (ref 4.0–10.5)

## 2015-07-16 LAB — BASIC METABOLIC PANEL
Anion gap: 9 (ref 5–15)
BUN: 11 mg/dL (ref 6–20)
CALCIUM: 8.4 mg/dL — AB (ref 8.9–10.3)
CO2: 27 mmol/L (ref 22–32)
Chloride: 102 mmol/L (ref 101–111)
Creatinine, Ser: 0.89 mg/dL (ref 0.44–1.00)
GFR calc Af Amer: >60 mL/min (ref >60)
GFR, EST NON AFRICAN AMERICAN: 58 mL/min — AB (ref >60)
GLUCOSE: 102 mg/dL — AB (ref 65–99)
Potassium: 3.9 mmol/L (ref 3.5–5.1)
Sodium: 138 mmol/L (ref 135–145)

## 2015-07-16 MED ORDER — TRAMADOL HCL 50 MG PO TABS: 50.0000 mg | ORAL_TABLET | Freq: Two times a day (BID) | ORAL | Status: DC | PRN

## 2015-07-16 NOTE — Discharge Summary (Signed)
Discharge Summary    Lindsay Park 13-Oct-1929 81 y.o. female  [id number]  Admission Date: 07/14/2015  Discharge Date: 07/16/15  Physician: Conrad Robinson, MD  Admission Diagnosis: Thoracic Penatrating Aortic Ulcer I71.2   HPI:   This is a 81 y.o. female who presents with chief complaint: follow up from cardiac evaluation. Previous studies demonstrate a Crawford 1 TAAA with likely sx thoracic PAU. I sent the patient for cardiac evaluation prior to considering a TEVAR coverage of the thoracic PAU. Her cardiologist has cleared her to proceed with repair. The patient continues to have intermittent L back pain requiring intermittent narcotic use.  Hospital Course:  The patient was admitted to the hospital and taken to the operating room on 07/14/2015 and underwent: Left subclavian artery to left common carotid artery transposition.      The pt tolerated the procedure well and was transported to the PACU.  In the PACU, the pt was re-intubated by anesthesia due to airway issues, felt to be upper airway.  On exam, symmetric breath sounds with equal lung inflation. No evidence of bleeding from surgical site. No hematoma evident. No tracheal deviation. Racemic epinephrine and steroids already given. Will transfer patient to SICU for hopefully ventilator went. PCCM has already been contacted.  By the next morning, the sedation was weaned and the pt was following commands and able to be extubated and remained extubated.  By POD 2, she did not have any residual upper airway issues.  Pt was discharged home on POD 2.  The remainder of the hospital course consisted of increasing mobilization and increasing intake of solids without difficulty.  CBC    Component Value Date/Time   WBC 10.1 07/16/2015 0244   RBC 4.22 07/16/2015 0244   HGB 11.3* 07/16/2015 0244   HCT 34.6* 07/16/2015 0244   PLT 405* 07/16/2015 0244   MCV 82.0 07/16/2015 0244   MCH 26.8 07/16/2015 0244   MCHC 32.7  07/16/2015 0244   RDW 18.3* 07/16/2015 0244   LYMPHSABS 1.0 06/10/2015 1234   MONOABS 1.1* 06/10/2015 1234   EOSABS 0.1 06/10/2015 1234   BASOSABS 0.0 06/10/2015 1234    BMET    Component Value Date/Time   NA 138 07/16/2015 0244   K 3.9 07/16/2015 0244   CL 102 07/16/2015 0244   CO2 27 07/16/2015 0244   GLUCOSE 102* 07/16/2015 0244   BUN 11 07/16/2015 0244   CREATININE 0.89 07/16/2015 0244   CALCIUM 8.4* 07/16/2015 0244   GFRNONAA 58* 07/16/2015 0244   GFRAA >60 07/16/2015 0244      Discharge Instructions    CAROTID Sugery: Call MD for difficulty swallowing or speaking; weakness in arms or legs that is a new symtom; severe headache.  If you have increased swelling in the neck and/or  are having difficulty breathing, CALL [phone]    Complete by:  As directed      Call MD for:  redness, tenderness, or signs of infection (pain, swelling, bleeding, redness, odor or green/yellow discharge around incision site)    Complete by:  As directed      Call MD for:  severe or increased pain, loss or decreased feeling  in affected limb(s)    Complete by:  As directed      Call MD for:  temperature >100.5    Complete by:  As directed      Discharge wound care:    Complete by:  As directed   Shower daily with soap and water starting 07/17/15  Driving Restrictions    Complete by:  As directed   No driving for 2 weeks     Lifting restrictions    Complete by:  As directed   No lifting for 2 weeks     Resume previous diet    Complete by:  As directed            Discharge Diagnosis:  Thoracic Penatrating Aortic Ulcer I71.2  Secondary Diagnosis: Patient Active Problem List   Diagnosis Date Noted  . Thoracic aortic aneurysm without rupture 07/14/2015  . Thoracoabdominal aortic aneurysm, without rupture 06/18/2015   Past Medical History  Diagnosis Date  . Hypercholesteremia     takes Atorvastatin daily  . CHF (congestive heart failure)   . Degenerative disc disease, lumbar   .  Anemia   . COPD (chronic obstructive pulmonary disease)   . PONV (postoperative nausea and vomiting)   . Shortness of breath dyspnea     with aneurysm  . Pneumonia 2015  . GERD (gastroesophageal reflux disease)     takes Pepcid daily  . Hypothyroidism     takes Synthroid daily  . Hypertension     takes Losartan and Metoprolol daily       Medication List    TAKE these medications        aspirin 81 MG tablet  Take 81 mg by mouth daily.     atorvastatin 20 MG tablet  Commonly known as:  LIPITOR  Take 20 mg by mouth daily.     famotidine 20 MG tablet  Commonly known as:  PEPCID  Take 20 mg by mouth daily as needed for heartburn or indigestion.     levothyroxine 100 MCG tablet  Commonly known as:  SYNTHROID, LEVOTHROID  Take 100 mcg by mouth daily before breakfast.     losartan 25 MG tablet  Commonly known as:  COZAAR  Take 25 mg by mouth daily.     metoprolol succinate 50 MG 24 hr tablet  Commonly known as:  TOPROL-XL  Take 50 mg by mouth daily. Take with or immediately following a meal.     traMADol 50 MG tablet  Commonly known as:  ULTRAM  Take 1 tablet (50 mg total) by mouth every 12 (twelve) hours as needed for moderate pain.     Vitamin D3 50000 UNITS Caps  Take 50,000 Units by mouth once a week. On Sunday.        Prescriptions given: Tramadol #30 No Refill  Instructions: 1.  Shower daily with soap and water starting 07/17/15   Disposition: home  Patient's condition: is Good  Follow up: 1. Dr. Bridgett Larsson in 2 weeks   Leontine Locket, PA-C [facility] [phone] 07/16/2015  9:25 AM   Addendum  I have independently interviewed and examined the patient, and I agree with the physician assistant's discharge summary.  This patient has a Crawford 1 thoracoabdominal aneurysm with symptomatic thoracic penetrating aortic ulcer.  I did not think this patient was going to be a candidate for open thoracoabdominal repair due to age and  co-morbidities.  Similarly, I felt that this patient would be a poor candidate for combine TEVAR+FEVAR.  As she is mainly sx from her thoracic PAU, we discussed repairing the PAU with a TEVAR first and watching the mesenteric component of her thoracoabdominal aneurysm.  Unfortunately, the PAU is too close to the left subclavian artery to just perform a TEVAR.  I discussed with the patient doing a L SCA to CCA  transposition to make it possible to do the TEVAR with coverage of the L SCA.  She underwent this procedure without any obvious complications despite having a diseased L SCA.  In the PACU, she developed possible laryngospasm and required reintubation.  She left on steroids for 24 hours and then extubated successfully the next day.  She was observed off the ventilator for another day, breathing without any difficulties.  She demonstrated full neurologic status during the entire admission.  The plan is to let her recover fully for another month and then proceed with the TEVAR.  She is scheduled to follow up 2 weeks.  Adele Barthel, MD [facility] [place] Office: [phone] Pager: [phone]  07/20/2015, 9:21 PM

## 2015-07-16 NOTE — Care Management Important Message (Signed)
Important Message  Patient Details  Name: Lindsay Park MRN: [id number] Date of Birth: 1929-09-28   Medicare Important Message Given:  Yes-second notification given    Nathen May 07/16/2015, 12:20 [place] Message  Patient Details  Name: Lindsay Park MRN: [id number] Date of Birth: [birth year]   Medicare Important Message Given:  Yes-second notification given    Nathen May 07/16/2015, 12:20 PM

## 2015-07-16 NOTE — Progress Notes (Addendum)
   Daily Progress Note  Assessment/Planning: POD #2 s/p L SCA to CCA transposition complicated by post-op laryngospasm   No residual upper airway issues  Ok to transfer to floor  Ok to discharge after PT/OT evaluation have clarified any rehab needs  Other medical issues: Past Medical History  Diagnosis Date  . Hypercholesteremia     takes Atorvastatin daily  . CHF (congestive heart failure)   . Degenerative disc disease, lumbar   . Anemia   . COPD (chronic obstructive pulmonary disease)   . PONV (postoperative nausea and vomiting)   . Shortness of breath dyspnea     with aneurysm  . Pneumonia 2015  . GERD (gastroesophageal reflux disease)     takes Pepcid daily  . Hypothyroidism     takes Synthroid daily  . Hypertension     takes Losartan and Metoprolol daily      Subjective  - 2 Days Post-Op  No complaints  Objective Filed Vitals:   07/16/15 0500 07/16/15 0600 07/16/15 0700 07/16/15 0806  BP: 131/77 116/68 127/75   Pulse: 88 75 77   Temp:   98.5 F (36.9 C)   TempSrc:   Oral   Resp: 20 18 21    Height:      Weight:      SpO2: 96% 96% 97% 99%    Intake/Output Summary (Last 24 hours) at 07/16/15 1157 Last data filed at 07/16/15 0700  Gross per 24 hour  Intake   1180 ml  Output   1340 ml  Net   -160 ml    PULM  CTAB CV  RRR GI  soft, NTND VASC  L neck inc c/d/i, no hematoma, palpable L radial pulse  Laboratory CBC    Component Value Date/Time   WBC 10.1 07/16/2015 0244   HGB 11.3* 07/16/2015 0244   HCT 34.6* 07/16/2015 0244   PLT 405* 07/16/2015 0244    BMET    Component Value Date/Time   NA 138 07/16/2015 0244   K 3.9 07/16/2015 0244   CL 102 07/16/2015 0244   CO2 27 07/16/2015 0244   GLUCOSE 102* 07/16/2015 0244   BUN 11 07/16/2015 0244   CREATININE 0.89 07/16/2015 0244   CALCIUM 8.4* 07/16/2015 0244   GFRNONAA 58* 07/16/2015 0244   GFRAA >60 07/16/2015 0244    Adele Barthel, MD [facility]  [place] Office: [phone] Pager: [phone]  07/16/2015, 8:22 AM

## 2015-07-16 NOTE — Progress Notes (Signed)
PULMONARY / CRITICAL CARE MEDICINE   Name: Lindsay Park MRN: [id number] DOB: 16-Oct-1929    ADMISSION DATE:  07/14/2015 CONSULTATION DATE:  8/10  REFERRING MD :  Bridgett Larsson   CHIEF COMPLAINT:  Post op respiratory failure, ?upper airway compromise   INITIAL PRESENTATION: 81yo female with hx CHF, COPD, HTN, thoracic AAA admitted 8/10 for elective vascular surgery as part of staging process to repair/stent aneurysm.  Post op was reversed and extubated, then developed dyspnea, confusion, respiratory failure and was re intubated. PCCM called to admit.   Of note - preop was very hoarse but no dyspnea.  STUDIES:    SIGNIFICANT EVENTS: 8/10>> OR, (Left subclavian artery to left common carotid artery transposition), post op resp failure. ?upper airway compromise>reintubated    SUBJECTIVE:  Afebrile Breathing ok No pain   VITAL SIGNS: Temp:  [98.1 F (36.7 C)-100.2 F (37.9 C)] 98.5 F (36.9 C) (08/12 0700) Pulse Rate:  [46-99] 73 (08/12 0800) Resp:  [13-24] 17 (08/12 0800) BP: (94-138)/(50-97) 117/75 mmHg (08/12 0800) SpO2:  [90 %-100 %] 99 % (08/12 0806) HEMODYNAMICS:   VENTILATOR SETTINGS:   INTAKE / OUTPUT:  Intake/Output Summary (Last 24 hours) at 07/16/15 0946 Last data filed at 07/16/15 0700  Gross per 24 hour  Intake   1080 ml  Output   1340 ml  Net   -260 ml    PHYSICAL EXAMINATION: General:  Thin, frail, female, NAD  Neuro:  Alert, interactive, non focal HEENT:  Mm moist, no JVD, no visible hematoma, small incision c/d  Cardiovascular:  s1s2 rrr, brady Lungs:  resps even non labored, good bilat breath sounds, essentially clear Abdomen:  Round, soft, +bs  Musculoskeletal:  Warm and dry, no edema    LABS:  CBC  Recent Labs Lab 07/14/15 1824 07/15/15 0439 07/16/15 0244  WBC 12.2* 12.4* 10.1  HGB 10.5* 11.1* 11.3*  HCT 32.1* 33.6* 34.6*  PLT 329 412* 405*   Coag's  Recent Labs Lab 07/13/15 1031  APTT 39*  INR 1.17   BMET  Recent Labs Lab  07/14/15 1824 07/15/15 0439 07/16/15 0244  NA  --  139 138  K  --  3.2* 3.9  CL  --  101 102  CO2  --  25 27  BUN  --  9 11  CREATININE 0.98 0.94 0.89  GLUCOSE  --  123* 102*   Electrolytes  Recent Labs Lab 07/15/15 0439 07/16/15 0244  CALCIUM 8.3* 8.4*   Sepsis Markers No results for input(s): LATICACIDVEN, PROCALCITON, O2SATVEN in the last 168 hours. ABG No results for input(s): PHART, PCO2ART, PO2ART in the last 168 hours. Liver Enzymes No results for input(s): AST, ALT, ALKPHOS, BILITOT, ALBUMIN in the last 168 hours. Cardiac Enzymes No results for input(s): TROPONINI, PROBNP in the last 168 hours. Glucose No results for input(s): GLUCAP in the last 168 hours.  Imaging No results found.   ASSESSMENT / PLAN:  PULMONARY OETT 8/10 >> A: Postop respiratory failure -unclear reason,? Residual anesthesia vs upper airway issues -there does not appear to be vagal injury or diaphragmatic paralysis COPD P:   Albuterol MDI prn as outpt  CARDIOVASCULAR  A: Thoracic AAA status post subclavian transposition Preop stress test was intermediate risk Echo 06/2015-moderate TR Htn P:  Postop care per vascular Resume metoprolol , losartan   RENAL A: hypokalemia P:  repleted  PCCM to sign off, dc planned    Kara Mead MD. FCCP. [facility] Pager [phone] If no response  call [phone]    07/16/2015  9:46 AM

## 2015-07-16 NOTE — Evaluation (Addendum)
Physical Therapy Evaluation Patient Details Name: Lindsay Park MRN: [id number] DOB: 12/09/1928 Today's Date: 07/16/2015   History of Present Illness  Adm 8/10 for Lt subclavian to Lt common carotid transposition (in prep for later thoracic aneurysm repair). post-op reintubated (?due to laryngospasm) with extubation 8/11 PMHx- CHF, COPD, thoracic aortic aneurysm  Clinical Impression  Patient evaluated by Physical Therapy with no further acute PT needs identified. All education has been completed and the patient has no further questions. Chart and pt report that her family has arranged to provide 24/7 care initially. Currently recommend supervision with mobility until she has ambulated more. PT is signing off. Thank you for this referral.     Follow Up Recommendations No PT follow up;Supervision for mobility/OOB    Equipment Recommendations  None recommended by PT    Recommendations for Other Services       Precautions / Restrictions Restrictions Weight Bearing Restrictions: No      Mobility  Bed Mobility Overal bed mobility: Modified Independent                Transfers Overall transfer level: Needs assistance Equipment used: None Transfers: Sit to/from Stand Sit to Stand: Supervision         General transfer comment: for safety; no dizziness  Ambulation/Gait Ambulation/Gait assistance: Min guard;Supervision Ambulation Distance (Feet): 400 Feet Assistive device: None Gait Pattern/deviations: WFL(Within Functional Limits)     General Gait Details: initially slight drifting lt and rt with independent recovery; after 150 ft she was able to maintain balance/straight path  Stairs            Wheelchair Mobility    Modified Rankin (Stroke Patients Only)       Balance Overall balance assessment: Needs assistance         Standing balance support: No upper extremity supported Standing balance-Leahy Scale: Fair                                Pertinent Vitals/Pain Pain Assessment: No/denies pain    Home Living Family/patient expects to be discharged to:: Private residence Living Arrangements: Children (daughter) Available Help at Discharge: Family;Available 24 hours/day (daughters and son have arranged to provide 24/7 care) Type of Home: House Home Access: Stairs to enter Entrance Stairs-Rails: None Entrance Stairs-Number of Steps: 1 Home Layout: One level Home Equipment: None      Prior Function Level of Independence: Independent               Hand Dominance        Extremity/Trunk Assessment   Upper Extremity Assessment: Overall WFL for tasks assessed           Lower Extremity Assessment: Overall WFL for tasks assessed      Cervical / Trunk Assessment: Normal  Communication   Communication: Expressive difficulties (hoarse; reports normal for her)  Cognition Arousal/Alertness: Awake/alert Behavior During Therapy: WFL for tasks assessed/performed Overall Cognitive Status: Within Functional Limits for tasks assessed                      General Comments      Exercises        Assessment/Plan    PT Assessment Patent does not need any further PT services  PT Diagnosis Difficulty walking   PT Problem List    PT Treatment Interventions     PT Goals (Current goals can be found in the Care Plan section)  Acute Rehab PT Goals PT Goal Formulation: All assessment and education complete, DC therapy    Frequency     Barriers to discharge        Co-evaluation               End of Session Equipment Utilized During Treatment: Gait belt Activity Tolerance: Patient tolerated treatment well Patient left: in chair;with call bell/phone within reach Nurse Communication: Mobility status (no PT needs)         Time: 4401-0272 PT Time Calculation (min) (ACUTE ONLY): 16 min   Charges:   PT Evaluation $Initial PT Evaluation Tier I: 1 Procedure     PT G Codes:         Merlie Noga Aug 10, 2015, 9:49 AM Pager [phone]

## 2015-07-16 NOTE — Progress Notes (Signed)
Discharge instructions reviewed with patient and daughters at bedside. Verbalized understanding. Perscriptions given and pt discharged via wheelchair to daughter's car.  Eleonore Chiquito RN  2 [place]

## 2015-07-20 ENCOUNTER — Telehealth: Payer: Self-pay | Admitting: Vascular Surgery

## 2015-07-20 NOTE — Telephone Encounter (Signed)
-----   Message from Denman George, RN sent at 07/16/2015 11:48 AM EDT ----- Regarding: needs 2 wk. f/u with Dr. Bridgett Larsson to discuss TEVAR   ----- Message -----    From: Gabriel Earing, PA-C    Sent: 07/16/2015   9:24 AM      To: Vvs Charge Pool  S/p carotid/subclavian bypass.  F/u with Dr. Bridgett Larsson in 2 weeks to ck pt and schedule TEVAR.  Thanks, Aldona Bar

## 2015-07-20 NOTE — Telephone Encounter (Signed)
Tried to call pts daughter at [phone], but her VM is full, LM on home #, dpm

## 2015-07-28 ENCOUNTER — Other Ambulatory Visit: Payer: Self-pay

## 2015-08-05 ENCOUNTER — Encounter: Payer: Self-pay | Admitting: Vascular Surgery

## 2015-08-06 ENCOUNTER — Encounter: Payer: Self-pay | Admitting: Vascular Surgery

## 2015-08-06 ENCOUNTER — Ambulatory Visit (INDEPENDENT_AMBULATORY_CARE_PROVIDER_SITE_OTHER): Payer: Self-pay | Admitting: Vascular Surgery

## 2015-08-06 ENCOUNTER — Other Ambulatory Visit: Payer: Self-pay

## 2015-08-06 VITALS — BP 139/73 | HR 90 | Temp 98.3°F | Ht 60.0 in | Wt 94.1 lb

## 2015-08-06 DIAGNOSIS — I716 Thoracoabdominal aortic aneurysm, without rupture, unspecified: Secondary | ICD-10-CM

## 2015-08-06 NOTE — Progress Notes (Signed)
Postoperative Visit   History of Present Illness  Lindsay Park is a 81 y.o. female who presents for postoperative follow-up for: L SCA to  L CCA transposition (Date: 07/14/15).  The patient's neck incision is healed.  The patient has had no stroke or TIA symptoms.  He episode of laryngospasm responds.  She feels betters with no chest pain.  Her voice also appears to have improved.  The patient notes she has had multiple episodes of allergic reactions over the last year without obvious trigger.   Past Medical History  Diagnosis Date  . Hypercholesteremia     takes Atorvastatin daily  . CHF (congestive heart failure)   . Degenerative disc disease, lumbar   . Anemia   . COPD (chronic obstructive pulmonary disease)   . PONV (postoperative nausea and vomiting)   . Shortness of breath dyspnea     with aneurysm  . Pneumonia 2015  . GERD (gastroesophageal reflux disease)     takes Pepcid daily  . Hypothyroidism     takes Synthroid daily  . Hypertension     takes Losartan and Metoprolol daily    Past Surgical History  Procedure Laterality Date  . Abdominal hysterectomy    . Stomach surgery      tumor removal from abdomen (benign)  . Thyroidectomy  [place]  . Cardiac catheterization      14 years ago  . Eye surgery      Nov 2015and Jan 2016 [place], [place]  . Carotid-subclavian bypass graft Left 07/14/2015    Procedure: LEFT SUBCLAVIAN to CAROTID ARTERY TRANSPOSITION ;  Surgeon: Conrad Macy, MD;  Location: [facility];  Service: Vascular;  Laterality: Left;    Social History   Social History  . Marital Status: Single    Spouse Name: N/A  . Number of Children: N/A  . Years of Education: N/A   Occupational History  . Not on file.   Social History Main Topics  . Smoking status: Former Smoker -- 30 years    Types: Cigarettes  . Smokeless tobacco: Never Used  . Alcohol Use: No  . Drug Use: No  . Sexual Activity: Not on file   Other Topics Concern  . Not on file     Social History Narrative    Family History  Problem Relation Age of Onset  . Heart disease Sister     before age 61  . Deep vein thrombosis Daughter   . COPD Daughter   . Hyperlipidemia Daughter   . Hypertension Daughter   . COPD Son   . Hyperlipidemia Son   . Hypertension Son     Current Outpatient Prescriptions  Medication Sig Dispense Refill  . aspirin 81 MG tablet Take 81 mg by mouth daily.    Marland Kitchen atorvastatin (LIPITOR) 20 MG tablet Take 20 mg by mouth daily.    . Cholecalciferol (VITAMIN D3) 50000 UNITS CAPS Take 50,000 Units by mouth once a week. On Sunday.    . famotidine (PEPCID) 20 MG tablet Take 20 mg by mouth daily as needed for heartburn or indigestion.     Marland Kitchen levothyroxine (SYNTHROID, LEVOTHROID) 100 MCG tablet Take 100 mcg by mouth daily before breakfast.    . losartan (COZAAR) 25 MG tablet Take 25 mg by mouth daily.    . metoprolol succinate (TOPROL-XL) 50 MG 24 hr tablet Take 50 mg by mouth daily. Take with or immediately following a meal.    . traMADol (ULTRAM)  50 MG tablet Take 1 tablet (50 mg total) by mouth every 12 (twelve) hours as needed for moderate pain. 30 tablet 0   No current facility-administered medications for this visit.     Allergies  Allergen Reactions  . Enalapril Other (See Comments)    Tongue swelling  . Latex Itching  . Penicillins Other (See Comments)    Unknown     REVIEW OF SYSTEMS:  (Positives checked otherwise negative)  CARDIOVASCULAR:   [x]  chest pain,  [ ]  chest pressure,  [ ]  palpitations,  [ ]  shortness of breath when laying flat,  [ ]  shortness of breath with exertion,   [ ]  pain in feet when walking,  [ ]  pain in feet when laying flat, [ ]  history of blood clot in veins (DVT),  [ ]  history of phlebitis,  [ ]  swelling in legs,  [ ]  varicose veins  PULMONARY:   [ ]  productive cough,  [ ]  asthma,  [ ]  wheezing  NEUROLOGIC:   [ ]  weakness in arms or legs,  [ ]  numbness in arms or legs,  [ ]  difficulty  speaking or slurred speech,  [ ]  temporary loss of vision in one eye,  [ ]  dizziness  HEMATOLOGIC:   [ ]  bleeding problems,  [ ]  problems with blood clotting too easily  MUSCULOSKEL:   [ ]  joint pain, [ ]  joint swelling  GASTROINTEST:   [ ]  vomiting blood,  [ ]  blood in stool     GENITOURINARY:   [ ]  burning with urination,  [ ]  blood in urine  PSYCHIATRIC:   [ ]  history of major depression  INTEGUMENTARY:   [ ]  rashes,  [ ]  ulcers  CONSTITUTIONAL:   [ ]  fever,  [ ]  chills   Physical Examination  Filed Vitals:   08/06/15 1123  BP: 139/73  Pulse:   Temp:    Pulmonary: Sym exp, good air movt, CTAB, no rales, rhonchi, & wheezing  Cardiac: RRR, Nl S1, S2, no Murmurs, rubs or gallops  Left Neck: Incision is healed  Neuro: CN 2-12 are intact , Motor strength is 5/5 bilaterally, sensation is grossly intact  Vascular: palpable Left radial and brachial arteries.   Medical Decision Making  Lindsay Park is a 81 y.o. female who presents s/p L SCA to CCA transposition, [place] 1 TAAA, Sx thoracic PAU   The L SCA to CCA transposition was done to facilitate TEVAR for her Sx thoracic PAU.  She has recovered enough to proceed with the TEVAR at this point and is scheduled for 15 Sept 26 The patient is aware the risks of endovascular aortic surgery include but are not limited to: bleeding, need for transfusion, infection, death, stroke, paralysis, wound complications, bowel ischemia, extended ventilation, anaphylactic reaction to contrast, contrast induced nephropathy, embolism, and need for additional procedure to address endoleaks.  She has agreed to proceed at this point. I discussed in depth with the patient the nature of atherosclerosis, and emphasized the importance of maximal medical management including strict control of blood pressure, blood glucose, and lipid levels, obtaining regular exercise, anti-platelet use and cessation of smoking.   The patient is  currently on an antiplatelet: ASA. The patient is currently on a statin: Lipitor. The patient is aware that without maximal medical management the underlying atherosclerotic disease process will progress, limiting the benefit of any interventions. The patient agrees to participate in their maximal medical care and routine surveillance.  Thank you for allowing [place] to  participate in this patient's care.  Adele Barthel, MD [facility] Office: [phone] Pager: [phone]

## 2015-08-10 ENCOUNTER — Ambulatory Visit: Payer: Medicare (Managed Care) | Admitting: Cardiology

## 2015-08-11 ENCOUNTER — Encounter (HOSPITAL_COMMUNITY)
Admission: RE | Admit: 2015-08-11 | Discharge: 2015-08-11 | Disposition: A | Discharge status: Home / Self Care | Payer: Medicare (Managed Care) | Source: Ambulatory Visit | Attending: Vascular Surgery | Admitting: Vascular Surgery

## 2015-08-11 ENCOUNTER — Encounter (HOSPITAL_COMMUNITY): Payer: Self-pay

## 2015-08-11 ENCOUNTER — Encounter: Payer: Medicare (Managed Care) | Admitting: Cardiology

## 2015-08-11 DIAGNOSIS — Z0183 Encounter for blood typing: Secondary | ICD-10-CM | POA: Insufficient documentation

## 2015-08-11 DIAGNOSIS — Z85850 Personal history of malignant neoplasm of thyroid: Secondary | ICD-10-CM | POA: Diagnosis not present

## 2015-08-11 DIAGNOSIS — I509 Heart failure, unspecified: Secondary | ICD-10-CM | POA: Diagnosis not present

## 2015-08-11 DIAGNOSIS — K219 Gastro-esophageal reflux disease without esophagitis: Secondary | ICD-10-CM | POA: Diagnosis not present

## 2015-08-11 DIAGNOSIS — Z87891 Personal history of nicotine dependence: Secondary | ICD-10-CM | POA: Insufficient documentation

## 2015-08-11 DIAGNOSIS — Z01818 Encounter for other preprocedural examination: Secondary | ICD-10-CM | POA: Diagnosis not present

## 2015-08-11 DIAGNOSIS — Z8673 Personal history of transient ischemic attack (TIA), and cerebral infarction without residual deficits: Secondary | ICD-10-CM | POA: Insufficient documentation

## 2015-08-11 DIAGNOSIS — Z01812 Encounter for preprocedural laboratory examination: Secondary | ICD-10-CM | POA: Diagnosis not present

## 2015-08-11 DIAGNOSIS — E785 Hyperlipidemia, unspecified: Secondary | ICD-10-CM | POA: Insufficient documentation

## 2015-08-11 DIAGNOSIS — Z7982 Long term (current) use of aspirin: Secondary | ICD-10-CM | POA: Diagnosis not present

## 2015-08-11 DIAGNOSIS — I10 Essential (primary) hypertension: Secondary | ICD-10-CM | POA: Insufficient documentation

## 2015-08-11 DIAGNOSIS — Z79899 Other long term (current) drug therapy: Secondary | ICD-10-CM | POA: Diagnosis not present

## 2015-08-11 DIAGNOSIS — J449 Chronic obstructive pulmonary disease, unspecified: Secondary | ICD-10-CM | POA: Diagnosis not present

## 2015-08-11 DIAGNOSIS — I716 Thoracoabdominal aortic aneurysm, without rupture: Secondary | ICD-10-CM | POA: Diagnosis not present

## 2015-08-11 HISTORY — DX: Cerebral infarction, unspecified: I63.9

## 2015-08-11 HISTORY — DX: Hyperglycemia, unspecified: R73.9

## 2015-08-11 LAB — CBC
HCT: 36.9 % (ref 36.0–46.0)
HEMOGLOBIN: 12.1 g/dL (ref 12.0–15.0)
MCH: 27.5 pg (ref 26.0–34.0)
MCHC: 32.8 g/dL (ref 30.0–36.0)
MCV: 83.9 fL (ref 78.0–100.0)
PLATELETS: 322 10*3/uL (ref 150–400)
RBC: 4.4 MIL/uL (ref 3.87–5.11)
RDW: 19.3 % — AB (ref 11.5–15.5)
WBC: 7.4 10*3/uL (ref 4.0–10.5)

## 2015-08-11 LAB — URINALYSIS, ROUTINE W REFLEX MICROSCOPIC
BILIRUBIN URINE: NEGATIVE
GLUCOSE, UA: NEGATIVE mg/dL
HGB URINE DIPSTICK: NEGATIVE
Ketones, ur: NEGATIVE mg/dL
Leukocytes, UA: NEGATIVE
Nitrite: NEGATIVE
PROTEIN: NEGATIVE mg/dL
Specific Gravity, Urine: 1.021 (ref 1.005–1.030)
UROBILINOGEN UA: 0.2 mg/dL (ref 0.0–1.0)
pH: 6 (ref 5.0–8.0)

## 2015-08-11 LAB — BLOOD GAS, ARTERIAL
ACID-BASE DEFICIT: 0.3 mmol/L (ref 0.0–2.0)
BICARBONATE: 23.3 meq/L (ref 20.0–24.0)
Drawn by: 421801
FIO2: 0.21
O2 SAT: 95.3 %
PATIENT TEMPERATURE: 98.6
TCO2: 24.4 mmol/L (ref 0–100)
pCO2 arterial: 34.8 mmHg — ABNORMAL LOW (ref 35.0–45.0)
pH, Arterial: 7.441 (ref 7.350–7.450)
pO2, Arterial: 79.4 mmHg — ABNORMAL LOW (ref 80.0–100.0)

## 2015-08-11 LAB — COMPREHENSIVE METABOLIC PANEL
ALBUMIN: 3.4 g/dL — AB (ref 3.5–5.0)
ALT: 11 U/L — ABNORMAL LOW (ref 14–54)
ANION GAP: 9 (ref 5–15)
AST: 22 U/L (ref 15–41)
Alkaline Phosphatase: 99 U/L (ref 38–126)
BUN: 14 mg/dL (ref 6–20)
CHLORIDE: 106 mmol/L (ref 101–111)
CO2: 22 mmol/L (ref 22–32)
Calcium: 8.9 mg/dL (ref 8.9–10.3)
Creatinine, Ser: 0.91 mg/dL (ref 0.44–1.00)
GFR calc non Af Amer: 56 mL/min — ABNORMAL LOW (ref >60)
GLUCOSE: 92 mg/dL (ref 65–99)
POTASSIUM: 4.1 mmol/L (ref 3.5–5.1)
SODIUM: 137 mmol/L (ref 135–145)
Total Bilirubin: 0.4 mg/dL (ref 0.3–1.2)
Total Protein: 6.8 g/dL (ref 6.5–8.1)

## 2015-08-11 LAB — SURGICAL PCR SCREEN
MRSA, PCR: NEGATIVE
Staphylococcus aureus: NEGATIVE

## 2015-08-11 LAB — PROTIME-INR
INR: 1.1 (ref 0.00–1.49)
Prothrombin Time: 14.4 seconds (ref 11.6–15.2)

## 2015-08-11 LAB — APTT: APTT: 34 s (ref 24–37)

## 2015-08-11 NOTE — Pre-Procedure Instructions (Signed)
Lindsay Park  08/11/2015      [id number] - [place], [place] - [address]OSS [address] [place] [address] Phone: [phone] Fax: [phone]    Your procedure is scheduled on 08/19/2015- Thursday.  Report to [facility] Admitting at  6:00 A.M.  Call this number if you have problems the morning of surgery:  [phone]   Remember:  Do not eat food or drink liquids after midnight.   On WEDNESDAY  Take these medicines the morning of surgery with A SIP OF WATER : Levothyroxine, Metoprolol, (pain medicine is ok if needed morning of surgery)   Do not wear jewelry, make-up or nail polish.  Do not wear lotions, powders, or perfumes.  You may wear deodorant.  Do not shave 48 hours prior to surgery.    Do not bring valuables to the hospital.  [facility] is not responsible for any belongings or valuables.  Contacts, dentures or bridgework may not be worn into surgery.  Leave your suitcase in the car.  After surgery it may be brought to your room.  For patients admitted to the hospital, discharge time will be determined by your treatment team.  Patients discharged the day of surgery will not be allowed to drive home.   Name and phone number of your driver:   /WITH FAMILY Special instructions:  Special Instructions: [place] - Preparing for Surgery  Before surgery, you can play an important role.  Because skin is not sterile, your skin needs to be as free of germs as possible.  You can reduce the number of germs on you skin by washing with CHG (chlorahexidine gluconate) soap before surgery.  CHG is an antiseptic cleaner which kills germs and bonds with the skin to continue killing germs even after washing.  Please DO NOT use if you have an allergy to CHG or antibacterial soaps.  If your skin becomes reddened/irritated stop using the CHG and inform your nurse when you arrive at Short Stay.  Do not shave (including  legs and underarms) for at least 48 hours prior to the first CHG shower.  You may shave your face.  Please follow these instructions carefully:   1.  Shower with CHG Soap the night before surgery and the  morning of Surgery.  2.  If you choose to wash your hair, wash your hair first as usual with your  normal shampoo.  3.  After you shampoo, rinse your hair and body thoroughly to remove the  Shampoo.  4.  Use CHG as you would any other liquid soap.  You can apply chg directly to the skin and wash gently with scrungie or a clean washcloth.  5.  Apply the CHG Soap to your body ONLY FROM THE NECK DOWN.    Do not use on open wounds or open sores.  Avoid contact with your eyes, ears, mouth and genitals (private parts).  Wash genitals (private parts)   with your normal soap.  6.  Wash thoroughly, paying special attention to the area where your surgery will be performed.  7.  Thoroughly rinse your body with warm water from the neck down.  8.  DO NOT shower/wash with your normal soap after using and rinsing off   the CHG Soap.  9.  Pat yourself dry with a clean towel.            10.  Wear clean pajamas.  11.  Place clean sheets on your bed the night of your first shower and do not sleep with pets.  Day of Surgery  Do not apply any lotions/deodorants the morning of surgery.  Please wear clean clothes to the hospital/surgery center.    Please read over the following fact sheets that you were given. Pain Booklet, Coughing and Deep Breathing, Blood Transfusion Information, MRSA Information and Surgical Site Infection Prevention

## 2015-08-11 NOTE — Progress Notes (Signed)
Patient ID: Lindsay Park, female   DOB: 11-Jun-1929, 81 y.o.   MRN: [id number]  No show

## 2015-08-11 NOTE — Progress Notes (Signed)
Call to A. Kabbe,NP, due to pt. & daughter's remarks about her throat swelling in PACU & that Dr. Bridgett Larsson told them that Anesth. would use a different plan next.

## 2015-08-13 NOTE — Progress Notes (Addendum)
Anesthesia Chart Review:  Pt is 81 year old female scheduled for a thoracic aortic endovascular stent graft, intravascular ultrasound on 08/19/2015 with Dr. Bridgett Larsson.   Cardiologist is Dr. Harl Bowie, last office visit 06/21/15.   PMH includes: CHF, HTN, hyperlipidmemia, anemia, stroke (1950's), thyroid cancer, dysrhythmia, GERD, COPD. Former smoker. BMI 18.5. S/p L subclavian to carotid artery transposition 07/14/15.   Anesthesia history includes post-op respiratory failure. After surgery 07/14/15, Patient was extubated in OR with stable hemodynamics and met all clinical criteria for extubation. Upon entering pacu she became agitated and voiced difficulty breathing. Difficult to ascultate bilateral breath sounds initially with transmitted rhonci in bilateral lungs. Differential diagnosis is subglottic stenosis from edema or undiagnosed tracheomalacia. Decadron 90m (for potential airway edema, total of 61mnow given) , morphine 65m30mprecedex 61m59mfent 50mc57mach titrated over span of 20 minutes, given separately) given for agitation, sedation, air hunger symptoms. Her strength on extubation intraoperatively was full, no pneumothorax currently on chest xray, bilateral breath sounds with no bronchospasm after intubation. The upper airway obstruction was relieved with re-intubation and there were no issues. Sedation was commenced with precedex as needed, phenylephrine for BP management, 250 albumin given for blood pressure support.   Medications include: ASA, lipitor, levothyroxine, losartan, metoprolol.   Preoperative labs reviewed.    1 view CXR 07/15/2015:  1. Endotracheal tube in good anatomic position. Interim placement of NG tube. Midportion of the NG tube is coiled in the esophagus. Side-port is at the gastroesophageal junction, tip is within the stomach. Repositioning should be considered. 2. Stable cardiomegaly. No acute pulmonary disease.  EKG 07/08/2015: Sinus rhythm. Probable left atrial  enlargement. Left anterior fascicular block. RSR' in V1 or V2, right VCD or RVH. LVH. Anterior Q waves, possibly due to LVH. Nonspecific T abnormalities, inferior leads. Prolonged QT interval  Nuclear stress test 06/22/2015:  The left ventricular ejection fraction is moderately decreased (30-44%).  Nuclear stress EF: 44%.  There was no ST segment deviation noted during stress.  This is an intermediate risk study.  Intermediate stress nuclear study with small, medium intensity, predominantly fixed defects in the inferior lateral, inferior septal and apical walls; minimal reversibility in the inferior septal wall; findings suggest inferior and apical thinning vs small prior infarct and very mild ischemia in the inferior septal wall; EF 44 with global hypokinesis. Dr. BrancHarl Bowies old blockages, no evidence new blockages and ok'd pt for surgery in artery transposition in August.   Echo 06/10/2015:  -Left ventricle: The cavity size was normal. Wall thickness was normal. Doppler parameters are consistent with abnormal left ventricular relaxation (grade 1 diastolic dysfunction). - Aortic valve: There was trivial regurgitation. - Aorta: There is severe atherosclerosis in the aortic arch and descending thoracic aorta. The suprarenal abdominal aorta is mildly aneurysmal (diameter 36 mm), with mural thrombus, and heavy spontaneous echo contrast. No intimal flap is seen. - Atrial septum: The septum bowed from right to left, consistent with increased right atrial pressure. - Tricuspid valve: Moderate, holosystolic prolapse. There was moderate regurgitation. - Pericardium, extracardiac: A small to moderate pericardial effusion was identified circumferential to the heart.  Carotid duplex US 6/[place]2016:  Mild bilateral carotid bifurcation plaque, with less than 50% diameter stenosis. The exam does not exclude plaque ulceration or embolization.  Pt has cardiac clearance for aneurysm from Dr. BrancHarl Bowiepic [place]  dated 06/21/15.  If no changes, I anticipate pt can proceed with surgery as scheduled.   AngelWilleen Cass-BC [facility] Phone: (  [phone] 08/17/2015 12:49 PM

## 2015-08-19 ENCOUNTER — Encounter (HOSPITAL_COMMUNITY)
Admission: RE | Disposition: A | Discharge status: Home / Self Care | Payer: Self-pay | Source: Ambulatory Visit | Attending: Vascular Surgery

## 2015-08-19 ENCOUNTER — Inpatient Hospital Stay (HOSPITAL_COMMUNITY): Payer: Medicare (Managed Care) | Admitting: Anesthesiology

## 2015-08-19 ENCOUNTER — Inpatient Hospital Stay (HOSPITAL_COMMUNITY): Payer: Medicare (Managed Care)

## 2015-08-19 ENCOUNTER — Inpatient Hospital Stay (HOSPITAL_COMMUNITY)
Admission: RE | Admit: 2015-08-19 | Discharge: 2015-08-24 | DRG: 219 | Disposition: A | Discharge status: Home / Self Care | Payer: Medicare (Managed Care) | Source: Ambulatory Visit | Attending: Vascular Surgery | Admitting: Vascular Surgery

## 2015-08-19 ENCOUNTER — Encounter (HOSPITAL_COMMUNITY): Payer: Self-pay | Admitting: Surgery

## 2015-08-19 ENCOUNTER — Inpatient Hospital Stay (HOSPITAL_COMMUNITY): Payer: Medicare (Managed Care) | Admitting: Vascular Surgery

## 2015-08-19 DIAGNOSIS — R109 Unspecified abdominal pain: Secondary | ICD-10-CM

## 2015-08-19 DIAGNOSIS — K5900 Constipation, unspecified: Secondary | ICD-10-CM | POA: Diagnosis not present

## 2015-08-19 DIAGNOSIS — R52 Pain, unspecified: Secondary | ICD-10-CM

## 2015-08-19 DIAGNOSIS — J9690 Respiratory failure, unspecified, unspecified whether with hypoxia or hypercapnia: Secondary | ICD-10-CM | POA: Insufficient documentation

## 2015-08-19 DIAGNOSIS — E876 Hypokalemia: Secondary | ICD-10-CM | POA: Diagnosis not present

## 2015-08-19 DIAGNOSIS — Z8249 Family history of ischemic heart disease and other diseases of the circulatory system: Secondary | ICD-10-CM | POA: Diagnosis not present

## 2015-08-19 DIAGNOSIS — Z8673 Personal history of transient ischemic attack (TIA), and cerebral infarction without residual deficits: Secondary | ICD-10-CM | POA: Diagnosis not present

## 2015-08-19 DIAGNOSIS — I712 Thoracic aortic aneurysm, without rupture, unspecified: Secondary | ICD-10-CM | POA: Diagnosis present

## 2015-08-19 DIAGNOSIS — D72829 Elevated white blood cell count, unspecified: Secondary | ICD-10-CM | POA: Diagnosis not present

## 2015-08-19 DIAGNOSIS — Z9889 Other specified postprocedural states: Secondary | ICD-10-CM

## 2015-08-19 DIAGNOSIS — J449 Chronic obstructive pulmonary disease, unspecified: Secondary | ICD-10-CM | POA: Diagnosis present

## 2015-08-19 DIAGNOSIS — E780 Pure hypercholesterolemia: Secondary | ICD-10-CM | POA: Diagnosis present

## 2015-08-19 DIAGNOSIS — I716 Thoracoabdominal aortic aneurysm, without rupture, unspecified: Secondary | ICD-10-CM

## 2015-08-19 DIAGNOSIS — Z90710 Acquired absence of both cervix and uterus: Secondary | ICD-10-CM

## 2015-08-19 DIAGNOSIS — S35511A Injury of right iliac artery, initial encounter: Secondary | ICD-10-CM | POA: Diagnosis not present

## 2015-08-19 DIAGNOSIS — Z7982 Long term (current) use of aspirin: Secondary | ICD-10-CM

## 2015-08-19 DIAGNOSIS — D689 Coagulation defect, unspecified: Secondary | ICD-10-CM | POA: Diagnosis not present

## 2015-08-19 DIAGNOSIS — R140 Abdominal distension (gaseous): Secondary | ICD-10-CM

## 2015-08-19 DIAGNOSIS — Z91040 Latex allergy status: Secondary | ICD-10-CM

## 2015-08-19 DIAGNOSIS — D62 Acute posthemorrhagic anemia: Secondary | ICD-10-CM | POA: Diagnosis not present

## 2015-08-19 DIAGNOSIS — E785 Hyperlipidemia, unspecified: Secondary | ICD-10-CM | POA: Diagnosis present

## 2015-08-19 DIAGNOSIS — Z888 Allergy status to other drugs, medicaments and biological substances status: Secondary | ICD-10-CM | POA: Diagnosis not present

## 2015-08-19 DIAGNOSIS — D696 Thrombocytopenia, unspecified: Secondary | ICD-10-CM | POA: Diagnosis not present

## 2015-08-19 DIAGNOSIS — I509 Heart failure, unspecified: Secondary | ICD-10-CM | POA: Diagnosis present

## 2015-08-19 DIAGNOSIS — G9341 Metabolic encephalopathy: Secondary | ICD-10-CM | POA: Diagnosis not present

## 2015-08-19 DIAGNOSIS — Z79899 Other long term (current) drug therapy: Secondary | ICD-10-CM | POA: Diagnosis not present

## 2015-08-19 DIAGNOSIS — I10 Essential (primary) hypertension: Secondary | ICD-10-CM | POA: Diagnosis present

## 2015-08-19 DIAGNOSIS — K219 Gastro-esophageal reflux disease without esophagitis: Secondary | ICD-10-CM | POA: Diagnosis present

## 2015-08-19 DIAGNOSIS — Z87891 Personal history of nicotine dependence: Secondary | ICD-10-CM

## 2015-08-19 DIAGNOSIS — J810 Acute pulmonary edema: Secondary | ICD-10-CM | POA: Diagnosis not present

## 2015-08-19 DIAGNOSIS — Y838 Other surgical procedures as the cause of abnormal reaction of the patient, or of later complication, without mention of misadventure at the time of the procedure: Secondary | ICD-10-CM | POA: Diagnosis not present

## 2015-08-19 DIAGNOSIS — Y92234 Operating room of hospital as the place of occurrence of the external cause: Secondary | ICD-10-CM | POA: Diagnosis not present

## 2015-08-19 DIAGNOSIS — E861 Hypovolemia: Secondary | ICD-10-CM | POA: Diagnosis not present

## 2015-08-19 DIAGNOSIS — E039 Hypothyroidism, unspecified: Secondary | ICD-10-CM | POA: Diagnosis not present

## 2015-08-19 DIAGNOSIS — Z880 Allergy status to penicillin: Secondary | ICD-10-CM | POA: Diagnosis not present

## 2015-08-19 DIAGNOSIS — E890 Postprocedural hypothyroidism: Secondary | ICD-10-CM | POA: Diagnosis present

## 2015-08-19 DIAGNOSIS — R4182 Altered mental status, unspecified: Secondary | ICD-10-CM | POA: Insufficient documentation

## 2015-08-19 DIAGNOSIS — Z85850 Personal history of malignant neoplasm of thyroid: Secondary | ICD-10-CM

## 2015-08-19 DIAGNOSIS — Z825 Family history of asthma and other chronic lower respiratory diseases: Secondary | ICD-10-CM | POA: Diagnosis not present

## 2015-08-19 DIAGNOSIS — J9601 Acute respiratory failure with hypoxia: Secondary | ICD-10-CM

## 2015-08-19 HISTORY — PX: THORACIC AORTIC ENDOVASCULAR STENT GRAFT: SHX6112

## 2015-08-19 LAB — PROTIME-INR
INR: 2.65 — ABNORMAL HIGH (ref 0.00–1.49)
INR: 2.7 — AB (ref 0.00–1.49)
PROTHROMBIN TIME: 27.9 s — AB (ref 11.6–15.2)
PROTHROMBIN TIME: 28.3 s — AB (ref 11.6–15.2)

## 2015-08-19 LAB — BLOOD GAS, ARTERIAL
Acid-base deficit: 7.8 mmol/L — ABNORMAL HIGH (ref 0.0–2.0)
BICARBONATE: 17.3 meq/L — AB (ref 20.0–24.0)
DRAWN BY: 345601
FIO2: 0.4
MECHVT: 370 mL
O2 Saturation: 98.5 %
PATIENT TEMPERATURE: 98.6
PCO2 ART: 35.4 mmHg (ref 35.0–45.0)
PEEP: 5 cmH2O
PO2 ART: 116 mmHg — AB (ref 80.0–100.0)
RATE: 16 resp/min
TCO2: 18.3 mmol/L (ref 0–100)
pH, Arterial: 7.309 — ABNORMAL LOW (ref 7.350–7.450)

## 2015-08-19 LAB — PREPARE RBC (CROSSMATCH)

## 2015-08-19 LAB — GLUCOSE, CAPILLARY: GLUCOSE-CAPILLARY: 181 mg/dL — AB (ref 65–99)

## 2015-08-19 LAB — APTT
aPTT: 59 seconds — ABNORMAL HIGH (ref 24–37)
aPTT: >200 seconds (ref 24–37)

## 2015-08-19 LAB — BASIC METABOLIC PANEL
Anion gap: 8 (ref 5–15)
BUN: 9 mg/dL (ref 6–20)
CALCIUM: 6.7 mg/dL — AB (ref 8.9–10.3)
CO2: 19 mmol/L — AB (ref 22–32)
CREATININE: 0.69 mg/dL (ref 0.44–1.00)
Chloride: 111 mmol/L (ref 101–111)
GFR calc non Af Amer: >60 mL/min (ref >60)
Glucose, Bld: 179 mg/dL — ABNORMAL HIGH (ref 65–99)
Potassium: 3.5 mmol/L (ref 3.5–5.1)
SODIUM: 138 mmol/L (ref 135–145)

## 2015-08-19 LAB — CBC
HCT: 33.1 % — ABNORMAL LOW (ref 36.0–46.0)
HCT: 27.5 % — ABNORMAL LOW (ref 36.0–46.0)
Hemoglobin: 10.8 g/dL — ABNORMAL LOW (ref 12.0–15.0)
Hemoglobin: 9.2 g/dL — ABNORMAL LOW (ref 12.0–15.0)
MCH: 29 pg (ref 26.0–34.0)
MCH: 29.2 pg (ref 26.0–34.0)
MCHC: 32.6 g/dL (ref 30.0–36.0)
MCHC: 33.5 g/dL (ref 30.0–36.0)
MCV: 89 fL (ref 78.0–100.0)
MCV: 87.3 fL (ref 78.0–100.0)
PLATELETS: 71 10*3/uL — AB (ref 150–400)
Platelets: 69 10*3/uL — ABNORMAL LOW (ref 150–400)
RBC: 3.72 MIL/uL — ABNORMAL LOW (ref 3.87–5.11)
RBC: 3.15 MIL/uL — ABNORMAL LOW (ref 3.87–5.11)
RDW: 15.4 % (ref 11.5–15.5)
RDW: 15.4 % (ref 11.5–15.5)
WBC: 14.9 10*3/uL — ABNORMAL HIGH (ref 4.0–10.5)
WBC: 23.4 10*3/uL — ABNORMAL HIGH (ref 4.0–10.5)

## 2015-08-19 LAB — LACTIC ACID, PLASMA: Lactic Acid, Venous: 4.4 mmol/L (ref 0.5–2.0)

## 2015-08-19 LAB — HEMOGLOBIN AND HEMATOCRIT, BLOOD
HEMATOCRIT: 30 % — AB (ref 36.0–46.0)
Hemoglobin: 10.2 g/dL — ABNORMAL LOW (ref 12.0–15.0)

## 2015-08-19 LAB — TROPONIN I: Troponin I: 0.08 ng/mL — ABNORMAL HIGH (ref <0.031)

## 2015-08-19 LAB — MAGNESIUM: Magnesium: 1.1 mg/dL — ABNORMAL LOW (ref 1.7–2.4)

## 2015-08-19 SURGERY — INSERTION, ENDOVASCULAR STENT GRAFT, AORTA, THORACIC
Anesthesia: General | Site: Abdomen

## 2015-08-19 MED ORDER — SODIUM CHLORIDE 0.9 % IV SOLN: INTRAVENOUS | Status: DC

## 2015-08-19 MED ORDER — FENTANYL CITRATE (PF) 250 MCG/5ML IJ SOLN
INTRAMUSCULAR | Status: AC
  Filled 2015-08-19: qty 5

## 2015-08-19 MED ORDER — FENTANYL BOLUS VIA INFUSION
25.0000 ug | INTRAVENOUS | Status: DC | PRN
  Filled 2015-08-19: qty 25

## 2015-08-19 MED ORDER — MEPERIDINE HCL 25 MG/ML IJ SOLN: 6.2500 mg | INTRAMUSCULAR | Status: DC | PRN

## 2015-08-19 MED ORDER — HYDRALAZINE HCL 20 MG/ML IJ SOLN
5.0000 mg | INTRAMUSCULAR | Status: DC | PRN
  Administered 2015-08-20: 5 mg via INTRAVENOUS
  Filled 2015-08-19 (×2): qty 1

## 2015-08-19 MED ORDER — PROTAMINE SULFATE 10 MG/ML IV SOLN
INTRAVENOUS | Status: DC | PRN
  Administered 2015-08-19 (×2): 15 mg via INTRAVENOUS
  Administered 2015-08-19: 30 mg via INTRAVENOUS

## 2015-08-19 MED ORDER — PROPOFOL 10 MG/ML IV BOLUS
INTRAVENOUS | Status: DC | PRN
  Administered 2015-08-19: 20 mg via INTRAVENOUS
  Administered 2015-08-19: 120 mg via INTRAVENOUS

## 2015-08-19 MED ORDER — ALBUMIN HUMAN 5 % IV SOLN
INTRAVENOUS | Status: DC | PRN
  Administered 2015-08-19 (×2): via INTRAVENOUS

## 2015-08-19 MED ORDER — VASOPRESSIN 20 UNIT/ML IV SOLN
INTRAVENOUS | Status: DC | PRN
  Administered 2015-08-19 (×3): 3 [IU] via INTRAVENOUS

## 2015-08-19 MED ORDER — LACTATED RINGERS IV SOLN
INTRAVENOUS | Status: DC
  Administered 2015-08-19 (×3): via INTRAVENOUS

## 2015-08-19 MED ORDER — ROCURONIUM BROMIDE 50 MG/5ML IV SOLN
INTRAVENOUS | Status: AC
  Filled 2015-08-19: qty 1

## 2015-08-19 MED ORDER — PANTOPRAZOLE SODIUM 40 MG PO TBEC: 40.0000 mg | DELAYED_RELEASE_TABLET | Freq: Every day | ORAL | Status: DC

## 2015-08-19 MED ORDER — MORPHINE SULFATE (PF) 2 MG/ML IV SOLN: 2.0000 mg | INTRAVENOUS | Status: DC | PRN

## 2015-08-19 MED ORDER — METOPROLOL SUCCINATE ER 50 MG PO TB24: 50.0000 mg | ORAL_TABLET | Freq: Every day | ORAL | Status: DC

## 2015-08-19 MED ORDER — PHENOL 1.4 % MT LIQD: 1.0000 | OROMUCOSAL | Status: DC | PRN

## 2015-08-19 MED ORDER — SODIUM CHLORIDE 0.9 % IV SOLN
Freq: Once | INTRAVENOUS | Status: AC
  Administered 2015-08-19: 21:00:00 via INTRAVENOUS

## 2015-08-19 MED ORDER — ROCURONIUM BROMIDE 100 MG/10ML IV SOLN
INTRAVENOUS | Status: DC | PRN
  Administered 2015-08-19: 20 mg via INTRAVENOUS
  Administered 2015-08-19: 50 mg via INTRAVENOUS

## 2015-08-19 MED ORDER — THROMBIN 20000 UNITS EX SOLR
CUTANEOUS | Status: DC | PRN
  Administered 2015-08-19: 20 mL via TOPICAL

## 2015-08-19 MED ORDER — MIDAZOLAM HCL 2 MG/2ML IJ SOLN: 1.0000 mg | INTRAMUSCULAR | Status: DC | PRN

## 2015-08-19 MED ORDER — PROMETHAZINE HCL 25 MG/ML IJ SOLN: 6.2500 mg | INTRAMUSCULAR | Status: DC | PRN

## 2015-08-19 MED ORDER — SODIUM CHLORIDE 0.9 % IV SOLN
INTRAVENOUS | Status: DC | PRN
  Administered 2015-08-19: 500 mL

## 2015-08-19 MED ORDER — POTASSIUM CHLORIDE CRYS ER 20 MEQ PO TBCR
20.0000 meq | EXTENDED_RELEASE_TABLET | Freq: Every day | ORAL | Status: AC | PRN
  Administered 2015-08-22: 20 meq via ORAL
  Filled 2015-08-19: qty 1

## 2015-08-19 MED ORDER — VANCOMYCIN HCL IN DEXTROSE 1-5 GM/200ML-% IV SOLN
INTRAVENOUS | Status: AC
Start: 2015-08-19 — End: 2015-08-19
  Filled 2015-08-19: qty 200

## 2015-08-19 MED ORDER — ALUM & MAG HYDROXIDE-SIMETH 200-200-20 MG/5ML PO SUSP: 15.0000 mL | ORAL | Status: DC | PRN

## 2015-08-19 MED ORDER — LABETALOL HCL 5 MG/ML IV SOLN
10.0000 mg | INTRAVENOUS | Status: DC | PRN
  Administered 2015-08-21: 10 mg via INTRAVENOUS
  Filled 2015-08-19: qty 4

## 2015-08-19 MED ORDER — CHLORHEXIDINE GLUCONATE CLOTH 2 % EX PADS: 6.0000 | MEDICATED_PAD | Freq: Once | CUTANEOUS | Status: DC

## 2015-08-19 MED ORDER — VANCOMYCIN HCL IN DEXTROSE 1-5 GM/200ML-% IV SOLN
1000.0000 mg | Freq: Two times a day (BID) | INTRAVENOUS | Status: DC
  Filled 2015-08-19: qty 200

## 2015-08-19 MED ORDER — MIDAZOLAM HCL 2 MG/2ML IJ SOLN
INTRAMUSCULAR | Status: AC
  Filled 2015-08-19: qty 4

## 2015-08-19 MED ORDER — MIDAZOLAM HCL 5 MG/5ML IJ SOLN
INTRAMUSCULAR | Status: DC | PRN
  Administered 2015-08-19: 2 mg via INTRAVENOUS

## 2015-08-19 MED ORDER — SODIUM CHLORIDE 0.9 % IV SOLN
INTRAVENOUS | Status: DC
  Administered 2015-08-19: 21:00:00 via INTRAVENOUS
  Administered 2015-08-20: 10 mL/h via INTRAVENOUS

## 2015-08-19 MED ORDER — EPHEDRINE SULFATE 50 MG/ML IJ SOLN
INTRAMUSCULAR | Status: DC | PRN
  Administered 2015-08-19 (×2): 10 mg via INTRAVENOUS

## 2015-08-19 MED ORDER — THROMBIN 20000 UNITS EX SOLR
CUTANEOUS | Status: AC
  Filled 2015-08-19: qty 20000

## 2015-08-19 MED ORDER — LACTATED RINGERS IV SOLN
INTRAVENOUS | Status: DC | PRN
  Administered 2015-08-19 (×4): via INTRAVENOUS

## 2015-08-19 MED ORDER — PROPOFOL 10 MG/ML IV BOLUS
INTRAVENOUS | Status: AC
  Filled 2015-08-19: qty 20

## 2015-08-19 MED ORDER — ASPIRIN EC 81 MG PO TBEC: 81.0000 mg | DELAYED_RELEASE_TABLET | Freq: Every day | ORAL | Status: DC

## 2015-08-19 MED ORDER — ACETAMINOPHEN 325 MG PO TABS
325.0000 mg | ORAL_TABLET | ORAL | Status: DC | PRN
  Administered 2015-08-21: 325 mg via ORAL
  Filled 2015-08-19: qty 1

## 2015-08-19 MED ORDER — HEPARIN SODIUM (PORCINE) 1000 UNIT/ML IJ SOLN
INTRAMUSCULAR | Status: DC | PRN
  Administered 2015-08-19 (×2): 5000 [IU] via INTRAVENOUS

## 2015-08-19 MED ORDER — ATORVASTATIN CALCIUM 20 MG PO TABS
20.0000 mg | ORAL_TABLET | Freq: Every day | ORAL | Status: DC
  Administered 2015-08-21 – 2015-08-24 (×4): 20 mg via ORAL
  Filled 2015-08-19 (×5): qty 1

## 2015-08-19 MED ORDER — VASOPRESSIN 20 UNIT/ML IV SOLN: INTRAVENOUS | Status: DC | PRN

## 2015-08-19 MED ORDER — ACETAMINOPHEN 650 MG RE SUPP: 325.0000 mg | RECTAL | Status: DC | PRN

## 2015-08-19 MED ORDER — LEVOTHYROXINE SODIUM 100 MCG PO TABS
100.0000 ug | ORAL_TABLET | Freq: Every day | ORAL | Status: DC
  Filled 2015-08-19: qty 1

## 2015-08-19 MED ORDER — PHENYLEPHRINE HCL 10 MG/ML IJ SOLN
10.0000 mg | INTRAVENOUS | Status: DC | PRN
  Administered 2015-08-19: 15 ug/min via INTRAVENOUS

## 2015-08-19 MED ORDER — TRAMADOL HCL 50 MG PO TABS
50.0000 mg | ORAL_TABLET | Freq: Four times a day (QID) | ORAL | Status: DC | PRN
  Administered 2015-08-22 – 2015-08-24 (×4): 50 mg via ORAL
  Filled 2015-08-19 (×4): qty 1

## 2015-08-19 MED ORDER — ONDANSETRON HCL 4 MG/2ML IJ SOLN
INTRAMUSCULAR | Status: AC
  Filled 2015-08-19: qty 2

## 2015-08-19 MED ORDER — DOCUSATE SODIUM 100 MG PO CAPS
100.0000 mg | ORAL_CAPSULE | Freq: Every day | ORAL | Status: DC
  Administered 2015-08-21 – 2015-08-24 (×4): 100 mg via ORAL
  Filled 2015-08-19 (×5): qty 1

## 2015-08-19 MED ORDER — SODIUM CHLORIDE 0.9 % IV SOLN: 500.0000 mL | Freq: Once | INTRAVENOUS | Status: DC | PRN

## 2015-08-19 MED ORDER — SODIUM CHLORIDE 0.9 % IV SOLN
25.0000 ug/h | INTRAVENOUS | Status: DC
  Administered 2015-08-19: 25 ug/h via INTRAVENOUS
  Filled 2015-08-19: qty 50

## 2015-08-19 MED ORDER — SENNOSIDES-DOCUSATE SODIUM 8.6-50 MG PO TABS
1.0000 | ORAL_TABLET | Freq: Every evening | ORAL | Status: DC | PRN
  Filled 2015-08-19: qty 1

## 2015-08-19 MED ORDER — METOPROLOL TARTRATE 1 MG/ML IV SOLN: 2.0000 mg | INTRAVENOUS | Status: DC | PRN

## 2015-08-19 MED ORDER — 0.9 % SODIUM CHLORIDE (POUR BTL) OPTIME
TOPICAL | Status: DC | PRN
  Administered 2015-08-19: 2500 mL

## 2015-08-19 MED ORDER — VANCOMYCIN HCL 500 MG IV SOLR
500.0000 mg | INTRAVENOUS | Status: AC
  Administered 2015-08-20: 500 mg via INTRAVENOUS
  Filled 2015-08-19: qty 500

## 2015-08-19 MED ORDER — VANCOMYCIN HCL IN DEXTROSE 1-5 GM/200ML-% IV SOLN
1000.0000 mg | INTRAVENOUS | Status: AC
  Administered 2015-08-19: 1000 mg via INTRAVENOUS

## 2015-08-19 MED ORDER — FENTANYL CITRATE (PF) 100 MCG/2ML IJ SOLN
50.0000 ug | Freq: Once | INTRAMUSCULAR | Status: DC
  Filled 2015-08-19: qty 2

## 2015-08-19 MED ORDER — FENTANYL CITRATE (PF) 100 MCG/2ML IJ SOLN
INTRAMUSCULAR | Status: DC | PRN
Start: 2015-08-19 — End: 2015-08-19
  Administered 2015-08-19: 50 ug via INTRAVENOUS
  Administered 2015-08-19: 25 ug via INTRAVENOUS
  Administered 2015-08-19 (×4): 50 ug via INTRAVENOUS
  Administered 2015-08-19: 25 ug via INTRAVENOUS

## 2015-08-19 MED ORDER — LOSARTAN POTASSIUM 25 MG PO TABS
25.0000 mg | ORAL_TABLET | Freq: Every day | ORAL | Status: DC
  Filled 2015-08-19: qty 1

## 2015-08-19 MED ORDER — IODIXANOL 320 MG/ML IV SOLN
INTRAVENOUS | Status: DC | PRN
  Administered 2015-08-19: 10 mL via INTRAVENOUS
  Administered 2015-08-19: 130 mL via INTRAVENOUS

## 2015-08-19 MED ORDER — ALBUTEROL SULFATE (2.5 MG/3ML) 0.083% IN NEBU: 2.5000 mg | INHALATION_SOLUTION | RESPIRATORY_TRACT | Status: DC | PRN

## 2015-08-19 MED ORDER — ENOXAPARIN SODIUM 40 MG/0.4ML ~~LOC~~ SOLN
40.0000 mg | SUBCUTANEOUS | Status: DC
  Filled 2015-08-19: qty 0.4

## 2015-08-19 MED ORDER — GUAIFENESIN-DM 100-10 MG/5ML PO SYRP: 15.0000 mL | ORAL_SOLUTION | ORAL | Status: DC | PRN

## 2015-08-19 MED ORDER — LIDOCAINE HCL (CARDIAC) 20 MG/ML IV SOLN
INTRAVENOUS | Status: DC | PRN
  Administered 2015-08-19: 60 mg via INTRAVENOUS

## 2015-08-19 MED ORDER — MAGNESIUM SULFATE 2 GM/50ML IV SOLN
2.0000 g | Freq: Every day | INTRAVENOUS | Status: DC | PRN
  Filled 2015-08-19: qty 50

## 2015-08-19 MED ORDER — HYDROMORPHONE HCL 1 MG/ML IJ SOLN: 0.2500 mg | INTRAMUSCULAR | Status: DC | PRN

## 2015-08-19 MED ORDER — LEVOTHYROXINE SODIUM 100 MCG IV SOLR
50.0000 ug | Freq: Every day | INTRAVENOUS | Status: DC
  Administered 2015-08-19 – 2015-08-20 (×2): 50 ug via INTRAVENOUS
  Filled 2015-08-19 (×3): qty 5

## 2015-08-19 MED ORDER — LIDOCAINE HCL (CARDIAC) 20 MG/ML IV SOLN
INTRAVENOUS | Status: AC
  Filled 2015-08-19: qty 5

## 2015-08-19 MED ORDER — PANTOPRAZOLE SODIUM 40 MG IV SOLR
40.0000 mg | Freq: Every day | INTRAVENOUS | Status: DC
Start: 2015-08-19 — End: 2015-08-21
  Administered 2015-08-19 – 2015-08-20 (×2): 40 mg via INTRAVENOUS
  Filled 2015-08-19 (×4): qty 40

## 2015-08-19 MED ORDER — ONDANSETRON HCL 4 MG/2ML IJ SOLN: 4.0000 mg | Freq: Four times a day (QID) | INTRAMUSCULAR | Status: DC | PRN

## 2015-08-19 SURGICAL SUPPLY — 82 items
BLADE CLIPPER SURG (BLADE) ×4 IMPLANT
CANISTER SUCTION 2500CC (MISCELLANEOUS) ×4 IMPLANT
CATH ACCU-VU SIZ PIG 5F 100CM (CATHETERS) ×4 IMPLANT
CATH BALLN TRILOBE 26-42 (BALLOONS) ×4 IMPLANT
CATH EMB 4FR 80CM (CATHETERS) ×4 IMPLANT
CATH VISIONS PV .035 IVUS (CATHETERS) ×4 IMPLANT
CLIP TI MEDIUM 24 (CLIP) ×4 IMPLANT
CLIP TI WIDE RED SMALL 24 (CLIP) ×4 IMPLANT
COVER PROBE W GEL 5X96 (DRAPES) ×12 IMPLANT
DEVICE CLOSURE PERCLS PRGLD 6F (VASCULAR PRODUCTS) ×8 IMPLANT
DRESSING OPSITE X SMALL 2X3 (GAUZE/BANDAGES/DRESSINGS) ×4 IMPLANT
DRSG TEGADERM 2-3/8X2-3/4 SM (GAUZE/BANDAGES/DRESSINGS) ×4 IMPLANT
DRYSEAL FLEXSHEATH 24FR 33CM (SHEATH) ×2
ELECT BLADE 4.0 EZ CLEAN MEGAD (MISCELLANEOUS) ×4
ELECT REM PT RETURN 9FT ADLT (ELECTROSURGICAL) ×8
ELECTRODE BLDE 4.0 EZ CLN MEGD (MISCELLANEOUS) ×2 IMPLANT
ELECTRODE REM PT RTRN 9FT ADLT (ELECTROSURGICAL) ×4 IMPLANT
ENDOPROSTHESIS THORAC 34X34X15 (Endovascular Graft) ×2 IMPLANT
ENDOPROSTHESIS THORAC 40X40X15 (Endovascular Graft) ×2 IMPLANT
ENDOPROTH THORACIC 34X34X15 (Endovascular Graft) ×4 IMPLANT
ENDOPROTH THORACIC 40X40X15 (Endovascular Graft) ×4 IMPLANT
GAUZE SPONGE 2X2 8PLY STRL LF (GAUZE/BANDAGES/DRESSINGS) ×2 IMPLANT
GAUZE SPONGE 4X4 16PLY XRAY LF (GAUZE/BANDAGES/DRESSINGS) ×4 IMPLANT
GLOVE BIO SURGEON STRL SZ7 (GLOVE) IMPLANT
GLOVE BIOGEL PI IND STRL 6.5 (GLOVE) ×4 IMPLANT
GLOVE BIOGEL PI IND STRL 7.5 (GLOVE) ×2 IMPLANT
GLOVE BIOGEL PI INDICATOR 6.5 (GLOVE) ×4
GLOVE BIOGEL PI INDICATOR 7.5 (GLOVE) ×2
GLOVE SURG SS PI 7.0 STRL IVOR (GLOVE) ×8 IMPLANT
GLOVE SURG SS PI 7.5 STRL IVOR (GLOVE) ×8 IMPLANT
GOWN STRL REUS W/ TWL LRG LVL3 (GOWN DISPOSABLE) ×8 IMPLANT
GOWN STRL REUS W/ TWL XL LVL3 (GOWN DISPOSABLE) ×2 IMPLANT
GOWN STRL REUS W/TWL LRG LVL3 (GOWN DISPOSABLE) ×8
GOWN STRL REUS W/TWL XL LVL3 (GOWN DISPOSABLE) ×2
GRAFT PROPATEN THIN WALL 6X40 (Vascular Products) ×4 IMPLANT
KIT BASIN OR (CUSTOM PROCEDURE TRAY) ×4 IMPLANT
KIT ROOM TURNOVER OR (KITS) ×4 IMPLANT
LIQUID BAND (GAUZE/BANDAGES/DRESSINGS) ×4 IMPLANT
LOOP VESSEL MAXI BLUE (MISCELLANEOUS) ×4 IMPLANT
LOOP VESSEL MINI RED (MISCELLANEOUS) ×4 IMPLANT
NEEDLE PERC 18GX7CM (NEEDLE) ×4 IMPLANT
NS IRRIG 1000ML POUR BTL (IV SOLUTION) ×8 IMPLANT
PACK ENDOVASCULAR (PACKS) ×4 IMPLANT
PAD ARMBOARD 7.5X6 YLW CONV (MISCELLANEOUS) ×8 IMPLANT
PATCH VASC XENOSURE 1CMX6CM (Vascular Products) IMPLANT
PATCH VASC XENOSURE 1X6 (Vascular Products) IMPLANT
PENCIL BUTTON HOLSTER BLD 10FT (ELECTRODE) ×4 IMPLANT
PERCLOSE PROGLIDE 6F (VASCULAR PRODUCTS) ×16
SHEATH AVANTI 11CM 5FR (MISCELLANEOUS) ×4 IMPLANT
SHEATH AVANTI 11CM 8FR (MISCELLANEOUS) ×4 IMPLANT
SHEATH DRYSEAL FLEX 24FR 33CM (SHEATH) ×2 IMPLANT
SPONGE GAUZE 2X2 STER 10/PKG (GAUZE/BANDAGES/DRESSINGS) ×2
SPONGE GAUZE 4X4 12PLY STER LF (GAUZE/BANDAGES/DRESSINGS) ×4 IMPLANT
SPONGE LAP 18X18 X RAY DECT (DISPOSABLE) ×4 IMPLANT
STAPLER VISISTAT (STAPLE) ×4 IMPLANT
STAPLER VISISTAT 35W (STAPLE) IMPLANT
STOPCOCK 4 WAY LG BORE MALE ST (IV SETS) ×4 IMPLANT
STOPCOCK MORSE 400PSI 3WAY (MISCELLANEOUS) ×4 IMPLANT
SUT ETHILON 3 0 PS 1 (SUTURE) IMPLANT
SUT MNCRL AB 4-0 PS2 18 (SUTURE) ×8 IMPLANT
SUT PROLENE 5 0 C 1 24 (SUTURE) ×24 IMPLANT
SUT PROLENE 6 0 BV (SUTURE) ×28 IMPLANT
SUT PROLENE 7 0 BV1 MDA (SUTURE) ×8 IMPLANT
SUT SILK 2 0 (SUTURE) ×4
SUT SILK 2-0 18XBRD TIE 12 (SUTURE) ×4 IMPLANT
SUT SILK 3 0 (SUTURE) ×2
SUT SILK 3-0 18XBRD TIE 12 (SUTURE) ×2 IMPLANT
SUT VIC AB 2-0 CT1 27 (SUTURE) ×2
SUT VIC AB 2-0 CT1 TAPERPNT 27 (SUTURE) ×2 IMPLANT
SUT VIC AB 2-0 CTX 36 (SUTURE) ×4 IMPLANT
SUT VIC AB 3-0 SH 27 (SUTURE) ×2
SUT VIC AB 3-0 SH 27X BRD (SUTURE) ×2 IMPLANT
SYR 30ML LL (SYRINGE) IMPLANT
SYRINGE 3CC LL L/F (MISCELLANEOUS) ×4 IMPLANT
TAPE CLOTH SURG 4X10 WHT LF (GAUZE/BANDAGES/DRESSINGS) ×4 IMPLANT
TRAY FOLEY W/METER SILVER 16FR (SET/KITS/TRAYS/PACK) ×4 IMPLANT
TUBING BULK SUCTION (MISCELLANEOUS) ×8 IMPLANT
TUBING HIGH PRESSURE 120CM (CONNECTOR) ×4 IMPLANT
WATER STERILE IRR 1000ML POUR (IV SOLUTION) ×4 IMPLANT
WIRE BENTSON .035X145CM (WIRE) IMPLANT
WIRE STIFF LUNDERQUIST 260CM (WIRE) IMPLANT
YANKAUER SUCT BULB TIP NO VENT (SUCTIONS) ×8 IMPLANT

## 2015-08-19 NOTE — Op Note (Signed)
OPERATIVE NOTE   PROCEDURE: 1. Bilateral common femoral artery cannulation under ultrasound guidance 2. Aortogram 3. Intravascular ultrasound 4. Endovascular repair of descending thoracic aorta, involving coverage of left subclavian artery (34 mm x 15 cm) 5. Right iliofemoral bypass 6. Radiological S&I  (Dictated by Dr. Scot Dock) 7. Placement of extension graft in descending thoracic aorta (40 mm x 15 cm)  PRE-OPERATIVE DIAGNOSIS: synmptomatic thoracic aorta penetrating ulcer  POST-OPERATIVE DIAGNOSIS: same as above   CO-SURGEONs: Adele Barthel, MD; Gae Gallop, MD  ASSISTANT(S): Gerri Lins, [facility]   ANESTHESIA: general  ESTIMATED BLOOD LOSS: 300 cc, unknown amount intra-abdominal bleeding  FINDING(S): 1.  Large thoracic aortic ulcer 2.  Aneurysmal thoracic aorta 3.  Patent left subclavian to carotid transposition 4.  Patent bovine arch: before and after TEVAR 5.  Successful exclusion of thoracic aortic ulcer without endoleak 6.  No evidence of extravasation at end of case 7.  On distal aortogram via new left femoral cannulation, frank extravasation at distal external iliac artery  8.  Rupture of distal right external iliac artery with intimal disruption: repaired with right iliofemoral bypass 9.  Dopplerable bilateral dorsalis pedis artery signals at end of case   SPECIMEN(S):  none  INDICATIONS:   Lindsay Park is a 81 y.o. female who presents with symptomatic penetrating aortic ulcer.  She has a thoracoabdominal aneurysm but her mesenteric segment is not large enough to require repair.  She had persistent chest pain felt to be due to an aortic PAU.  I completed previous a left subclavian to carotid transposition to facilitate placement of a thoracic endograft.  She returns today for the endograft. She is aware of the risk, benefits, and alternatives.  She has elected to proceed regardless of the risk.  DESCRIPTION: After obtaining full informed written consent, the  patient was brought back to the operating room and placed supine upon the operating table.  The patient received IV antibiotics prior to induction.  After obtaining adequate anesthesia, the patient was prepped and draped in the standard fashion for: thoracic endograft placement.  Under ultrasound guidance,  The artery was then cannulated with a 18 gauge needle.  The [facility] wire was passed up into the aorta.  A 8-Fr dilator was loaded over the wire to dilate the subcutaneous tract.  I performed the "pre-close" technique on this right common femoral artery.  I then loaded a 8-Fr sheath over the wire.  I passed a Benson wire with pigtail catheter up into the ascending aorta.  The wire was exchanged for a double curved Lundiquest wire.  I gave the patient 5000 units of Heparin intravenously.  The catheter was removed and then the right sheath was exchanged for a 24-Fr Dryseal sheath.  I then placed an IVUS catheter over the wire.  I interrogated the entire arterial system from distal aorta up to innominate artery.  This allowed me to identify the location of the penetrating aortic ulcer and the innominate artery.  I then placed the proximal endograft Gore TAG (34 mm x 15 cm) over the wire into the aortic arch.  I then placed a buddy wire through the Dryseal sheath, advancing a Benson wire into the ascending aorta.  The pigtail catheter was also advanced over this wire.  The catheter was connected to the power injector.  An arch aortogram at 60 degree LAO was completed.  The findings are listed above.  I then deployed the proximal TAG graft while DR. Dickson applied forward pressure on the wire,  causing the graft to lay again the greater curvature.  This graft deployed distal to the innominate orifice with adequate overlap proximal to the PAU.  The stent delivery device was removed.  At this point, I rewired the pigtail catheter and pulled both the [facility] wire and catheter back into the distal thoracic aorta.  I  advanced the catheter into the previous endograft.  A power injector demonstrated the distal thoracic anatomy.  The pigtail catheter and wire were removed.  Dr. Scot Dock then loaded the extension graft, Gore TAG 40 mm x 15 cm, into the distal aorta with adequate overlap.  The device was deployed proximal to the level of the diaphragm.  At this point, the stent delivery system was removed.  I placed a tri-lobe balloon and balloon the proximal and distal graft.   The balloon was removed and the pigtail catheter was replaced over a wire in the ascending aorta.  A completion aortogram demonstrated widely innominate artery and no evidence of endoleak.  The thoracic penetrating aortic ulcer was no longer present.  I placed an Amplatz wire in the pgitail catheter.  I then pulled the right sheath into the proximal external iliac artery.  No obvious bleeding was evident.    At this point, I pulled out the sheath.  There was no change in blood pressure.  I then tightened the proglide sutures medially and laterally.  There continued to be persistent bleeding.  I placed another two proglide device and this  appeared to control the bleeding.  I gave 30 mg of Protamine.  At this point, I noticed the blood pressure had plummeted and the entire abdomen appeared to be more swollen.  At this point, I felt further evaluation was necessary.  I cannulated the left common femoral artery under ultrasound guidance with a 18 gauge needle.  I then placed a Benson wire into the aorta.  The needle was exchanged for a 5-Fr sheath.  I loaded a pigtail catheter into the distal aorta.  The catheter was connected to the power injector.  This aortogram demonstrated intact aortic bifurcation but now a delayed extravasation adjacent to the the right iliac artery.    At this point, I elected to explore the right retroperitoneum via the right groin.  I made an incisions proximally and distally to the prior cannulation site.  I dissected down to the  inguinal ligament then transected some of the ligament to get exposure of the external iliac artery.  Upon retraction of the inguinal ligament, this decompressed 300-500 cc of arterial blood.  I was able to get my finger on a hole in the external iliac artery.  I placed a sponge stick on the external iliac artery proximally and then was able to dissect out the common femoral artery.  I clamped this artery.  I then dissected out the distal external iliac artery.  Dr. Scot Dock put a clamp on this.  This gain control of this artery.  I then serial repaired this artery with 6-0 sutures.  However, after releasing all clamps, I did not feel there was an adequate pulse in the right common femoral artery.  I reclamped the distal external iliac artery and also clamped the common femoral artery.  I made an arteriotomy and extended it proximally.  This demonstrated an extensive intimal flap which was causing a dissection flap.  This also allowed me to visualize a near total disruption of the lateral wall of the external iliac artery.  I felt that this  wall was likely to rupture.  I elected to sew an external iliac artery to common femoral artery bypass with Propaten.  Prior to proceed, I elected to pass a 4 Fogarty proximally and distally, obtain not thrombus.  I gave another 5000 units of Heparin intravenously.  I sharply cleaned up this iliofemoral segment of the artery, transecting the segment with intimal disruption.  I spatulated the graft to the dimensions of the distal external iliac artery.  I sewed the graft to the distal external iliac artery in an end-to-end fashion with a running stitch of 6-0 Prolene.  I then spatulated the distal end of this graft, adjusting the length in the process.  The graft was sewn to the common femoral artery with a running stitch of 6-0 Prolene.  I backbled the common femoral artery: no thrombus was present.  I also allowed the graft to bleed antegrade.  I finished this anastomosis in  the standard fashion.  I release all clamps and there was return of a right common femoral artery pulse.  Distally, both posterior tibial artery and anterior tibial artery signals could be found with doppler.  At this point, I washed out the right groin and repaired a few bleeding point with 6-0 Prolene.  I applied thrombin and gelfoam and no further bleeding was occurring.  At this point, we allowed the retroperitoneum to fall back into normal anatomic position.  I repaired the fascia with a a running stich of 2-0 Vicryl.  The deep subcutaneous tissue was repaired with a double layer of 2-0 Vicryl.  The superficial tissue was reapproximated with a running stitch of 3-0 Vicryl.  I elected to close the skin with staples.  At this point, left common femoral artery sheath was removed and pressure held for 15 minutes.  Distally this patient had a dopplerable left dorsalis pedis artery.   COMPLICATIONS: rupture of right distal external iliac artery   CONDITION: crtiical   Adele Barthel, MD [facility] Office: [phone] Pager: [phone]  08/19/2015, 7:58 PM

## 2015-08-19 NOTE — Anesthesia Preprocedure Evaluation (Addendum)
Anesthesia Evaluation  Patient identified by MRN, date of birth, ID band Patient awake    Reviewed: Allergy & Precautions, NPO status , Patient's Chart, lab work & pertinent test results, reviewed documented beta blocker date and time   History of Anesthesia Complications (+) PONV and history of anesthetic complications  Airway Mallampati: I       Dental  (+) Missing, Poor Dentition   Pulmonary shortness of breath and with exertion, pneumonia, resolved, COPD, former smoker,     + wheezing + stridor     Cardiovascular hypertension, Pt. on home beta blockers and Pt. on medications + Peripheral Vascular Disease and +CHF  + dysrhythmias  Rhythm:Regular Rate:Normal  Echo 06/2015 - Left ventricle: The cavity size was normal. Wall thickness wasnormal. Doppler parameters are consistent with abnormal leftventricular relaxation (grade 1 diastolic dysfunction). - Aortic valve: There was trivial regurgitation. - Aorta: There is severe atherosclerosis in the aortic arch anddescending thoracic aorta. The suprarenal abdominal aorta ismildly aneurysmal (diameter 36 mm), with mural thrombus, andheavy spontaneous echo contrast. No intimal flap is seen. - Atrial septum: The septum bowed from right to left, consistentwith increased right atrial pressure. - Tricuspid valve: Moderate, holosystolicprolapse. There wasmoderate regurgitation. - Pericardium, extracardiac: A small to moderate pericardialeffusion was identified circumferential to the heart.    Neuro/Psych negative neurological ROS     GI/Hepatic Neg liver ROS, GERD  ,  Endo/Other  Hypothyroidism   Renal/GU negative Renal ROS     Musculoskeletal  (+) Arthritis ,   Abdominal Normal abdominal exam  (+)  Abdomen: soft.    Peds  Hematology  (+) anemia ,   Anesthesia Other Findings   Reproductive/Obstetrics                          Anesthesia  Physical  Anesthesia Plan  ASA: IV  Anesthesia Plan: General   Post-op Pain Management:    Induction: Intravenous  Airway Management Planned: Oral ETT  Additional Equipment: Arterial line, CVP and Ultrasound Guidance Line Placement  Intra-op Plan:   Post-operative Plan: Extubation in OR  Informed Consent: I have reviewed the patients History and Physical, chart, labs and discussed the procedure including the risks, benefits and alternatives for the proposed anesthesia with the patient or authorized representative who has indicated his/her understanding and acceptance.   Dental advisory given  Plan Discussed with: CRNA  Anesthesia Plan Comments:        Anesthesia Quick Evaluation

## 2015-08-19 NOTE — Progress Notes (Signed)
     Patient intubated, moving head some and eyes open. Palpable DP pulses bilateral Left groin soft without hematoma, right groin dressing clean and dry Abdomin distended  S/P  PROCEDURE: 1. Bilateral common femoral artery cannulation under ultrasound guidance 2. Aortogram 3. Intravascular ultrasound 4. Endovascular repair of descending thoracic aorta, involving coverage of left subclavian artery (34 mm x 15 cm) 5. Right iliofemoral bypass 6. COMPLICATIONS: rupture of right distal external iliac artery Radiological S&I  Transfused 4 units PRBC intra operative. FFP ordered and platelets  COLLINS, EMMA MAUREEN PA-C

## 2015-08-19 NOTE — Progress Notes (Signed)
ANTIBIOTIC CONSULT NOTE - INITIAL  Pharmacy Consult for Vancomycin Indication: post-op prophylaxis  Allergies  Allergen Reactions  . Enalapril Other (See Comments)    Tongue swelling  . Latex Itching  . Penicillins Other (See Comments)    Unknown    Patient Measurements: Height: 5' (152.4 cm) Weight: 94 lb 11 oz (42.95 kg) IBW/kg (Calculated) : 45.5  Vital Signs: Pulse Rate: 71 (09/15 1520) Intake/Output from previous day:   Intake/Output from this shift: Total I/O In: 1100 [I.V.:600; IV Piggyback:500] Out: 175 [Urine:75; Blood:100]  Labs:  Recent Labs  08/19/15 1933  WBC 14.9*  HGB 10.8*  PLT 71*   Estimated Creatinine Clearance: 31.2 mL/min (by C-G formula based on Cr of 0.91). No results for input(s): VANCOTROUGH, VANCOPEAK, VANCORANDOM, GENTTROUGH, GENTPEAK, GENTRANDOM, TOBRATROUGH, TOBRAPEAK, TOBRARND, AMIKACINPEAK, AMIKACINTROU, AMIKACIN in the last 72 hours.   Microbiology: Recent Results (from the past 720 hour(s))  Surgical pcr screen     Status: None   Collection Time: 08/11/15  4:01 PM  Result Value Ref Range Status   MRSA, PCR NEGATIVE NEGATIVE Final   Staphylococcus aureus NEGATIVE NEGATIVE Final    Comment:        The Xpert SA Assay (FDA approved for NASAL specimens in patients over 54 years of age), is one component of a comprehensive surveillance program.  Test performance has been validated by [facility] for patients greater than or equal to 20 year old. It is not intended to diagnose infection nor to guide or monitor treatment.     Medical History: Past Medical History  Diagnosis Date  . Hypercholesteremia     takes Atorvastatin daily  . CHF (congestive heart failure)   . Anemia   . COPD (chronic obstructive pulmonary disease)   . Shortness of breath dyspnea     with aneurysm  . GERD (gastroesophageal reflux disease)     takes Pepcid daily  . Hypothyroidism     takes Synthroid daily  . Hypertension     takes Losartan and  Metoprolol daily  . 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 Dysrhythmia   . Pneumonia 2015    treated from home, not hosp.   Marland Kitchen Hyperglycemia     treated with oral meds., no insulin, several yrs. ago & since then has been resolved.   . Degenerative disc disease, lumbar     back, knees   . Cancer     thyroid Ca- has had removal  . PONV (postoperative nausea and vomiting)     daughter remarks that her throat swelled after surgery- Aug. 2016, that it was said that they would "use a different medicine"    Medications:  Scheduled:  . [START ON 08/20/2015] atorvastatin  20 mg Oral Daily  . [START ON 08/20/2015] docusate sodium  100 mg Oral Daily  . [START ON 08/20/2015] enoxaparin (LOVENOX) injection  40 mg Subcutaneous Q24H  . fentaNYL (SUBLIMAZE) injection  50 mcg Intravenous Once  . [START ON 08/20/2015] levothyroxine  100 mcg Oral QAC breakfast  . [START ON 08/20/2015] losartan  25 mg Oral QAC breakfast  . [START ON 08/20/2015] metoprolol succinate  50 mg Oral Daily  . pantoprazole (PROTONIX) IV  40 mg Intravenous Daily  . vancomycin      . vancomycin  1,000 mg Intravenous Q12H   Assessment: 81 yo F s/p repair of thoracic aorta penetrating ulcer.  Pt received Vancomycin 1gm IV given in OR at 1540.  To continue Vancomycin for 24  hours post-op prophylaxis.  Goal of Therapy:  Vancomycin trough level 10-15 mcg/ml  Plan:  Vancomycin 500 mg IV x 1 dose - tomorrow at [employer], Pharm.D., BCPS Clinical Pharmacist Pager [phone] 08/19/2015 8:41 PM

## 2015-08-19 NOTE — Consult Note (Signed)
PULMONARY / CRITICAL CARE MEDICINE   Name: Lindsay Park MRN: [id number] DOB: [birth year]    ADMISSION DATE:  08/19/2015 CONSULTATION DATE:  08/19/2015  REFERRING MD :  Bridgett Larsson  CHIEF COMPLAINT:  Vent management  INITIAL PRESENTATION:  81 y.o. F admitted 9/15 for endovascular repair of descending thoracic aorta ulcer.  Case complicated by rupture of distal right external iliac artery with intimal disruption which was then repaired with a right iliofemoral bypass.  Following the case, pt remained on the ventilator and PCCM called for vent management.    STUDIES:  None.  SIGNIFICANT EVENTS: 9/15 OR for endovascular repair of descending thoracic aorta involving coverage of left subclavian artery > complicated by rupture of distal right external iliac artery with intimal disruption.  This was repaired with a right iliofemoral bypass   HISTORY OF PRESENT ILLNESS:  Pt is encephalopathic; therefore, this HPI is obtained from chart review. Lindsay Park is a 81 y.o. F with PMH as outlined below.  She was taken to the OR by Dr. Bridgett Larsson of vascular surgery on 9/15 due to symptomatic thoracic aorta penetrating ulcer.  She had endovascular repair of descending thoracic aorta involving coverage of left subclavian artery.  Surgery was complicated by rupture of distal right external iliac artery with intimal disruption.  This was repaired with a right iliofemoral bypass.  Pt was also transfused 4u PRBC at the end of the case due to blood loss.  Following the case, she remained on the ventilator and PCCM was consulted for vent management.   PAST MEDICAL HISTORY :   has a past medical history of Hypercholesteremia; CHF (congestive heart failure); Anemia; COPD (chronic obstructive pulmonary disease); Shortness of breath dyspnea; GERD (gastroesophageal reflux disease); Hypothyroidism; Hypertension; Stroke (1950's); Dysrhythmia; Pneumonia (2015); Hyperglycemia; Degenerative disc disease, lumbar; Cancer; and PONV  (postoperative nausea and vomiting).  has past surgical history that includes Abdominal hysterectomy; Stomach surgery; Thyroidectomy (1983); Cardiac catheterization; Eye surgery; and Carotid-subclavian Bypass Graft (Left, 07/14/2015). Prior to Admission medications   Medication Sig Start Date End Date Taking? Authorizing Provider  aspirin 81 MG tablet Take 81 mg by mouth daily.   Yes Historical Provider, MD  atorvastatin (LIPITOR) 20 MG tablet Take 20 mg by mouth daily.   Yes Historical Provider, MD  Cholecalciferol (VITAMIN D3) 50000 UNITS CAPS Take 50,000 Units by mouth once a week. On Sunday.   Yes Historical Provider, MD  levothyroxine (SYNTHROID, LEVOTHROID) 100 MCG tablet Take 100 mcg by mouth daily before breakfast.   Yes Historical Provider, MD  losartan (COZAAR) 25 MG tablet Take 25 mg by mouth daily before breakfast.    Yes Historical Provider, MD  metoprolol succinate (TOPROL-XL) 50 MG 24 hr tablet Take 50 mg by mouth daily. Take with or immediately following a meal. Takes mid morning   Yes Historical Provider, MD  traMADol (ULTRAM) 50 MG tablet Take 50 mg by mouth 2 (two) times daily as needed.   Yes Historical Provider, MD   Allergies  Allergen Reactions  . Enalapril Other (See Comments)    Tongue swelling  . Latex Itching  . Penicillins Other (See Comments)    Unknown    FAMILY HISTORY:  Family History  Problem Relation Age of Onset  . Heart disease Sister     before age 45  . Deep vein thrombosis Daughter   . COPD Daughter   . Hyperlipidemia Daughter   . Hypertension Daughter   . COPD Son   . Hyperlipidemia Son   . Hypertension Son  SOCIAL HISTORY:  reports that she has quit smoking. Her smoking use included Cigarettes. She quit after 30 years of use. She quit smokeless tobacco use about 40 years ago. She reports that she does not drink alcohol or use illicit drugs.  REVIEW OF SYSTEMS:  Unable to obtain as pt is encephalopathic.  SUBJECTIVE:   VITAL  SIGNS: Temp:  [97.1 F (36.2 C)] 97.1 F (36.2 C) (09/15 0654) Pulse Rate:  [67-80] 71 (09/15 1520) Resp:  [18] 18 (09/15 0654) BP: (125)/(85) 125/85 mmHg (09/15 0654) SpO2:  [100 %] 100 % (09/15 0654) Weight:  [42.95 kg (94 lb 11 oz)] 42.95 kg (94 lb 11 oz) (09/15 0654) HEMODYNAMICS:   VENTILATOR SETTINGS:   INTAKE / OUTPUT: Intake/Output      09/15 0701 - 09/16 0700   I.V. (mL/kg) 4600 (107.1)   Blood 1340   IV Piggyback 500   Total Intake(mL/kg) 6440 (149.9)   Urine (mL/kg/hr) 375 (0.6)   Blood 1200 (2)   Total Output 1575   Net +4865         PHYSICAL EXAMINATION: General: Adult AA female, in NAD. Neuro: Sedated on vent. HEENT: [place]/AT. PERRL, sclerae anicteric. Cardiovascular: RRR, no M/R/G.  Lungs: Respirations even and unlabored.  CTA bilaterally, No W/R/R. Abdomen: RLQ incision with dressings C/D/I.  BS hypoactive, abdomen is slightly distended but is soft without rigidity. Musculoskeletal: No gross deformities, no edema.  Skin: Intact, warm, no rashes.  LABS:  CBC  Recent Labs Lab 08/19/15 1933  WBC 14.9*  HGB 10.8*  HCT 33.1*  PLT 71*   Coag's  Recent Labs Lab 08/19/15 1933  APTT 59*  INR 2.65*   BMET No results for input(s): NA, K, CL, CO2, BUN, CREATININE, GLUCOSE in the last 168 hours. Electrolytes No results for input(s): CALCIUM, MG, PHOS in the last 168 hours. Sepsis Markers No results for input(s): LATICACIDVEN, PROCALCITON, O2SATVEN in the last 168 hours. ABG No results for input(s): PHART, PCO2ART, PO2ART in the last 168 hours. Liver Enzymes No results for input(s): AST, ALT, ALKPHOS, BILITOT, ALBUMIN in the last 168 hours. Cardiac Enzymes No results for input(s): TROPONINI, PROBNP in the last 168 hours. Glucose No results for input(s): GLUCAP in the last 168 hours.  Imaging No results found.    ASSESSMENT / PLAN:  PULMONARY OETT 9/15 >>> A: VDRF following endovascular repair of descending thoracic aorta 9/15. Hx COPD  by report - no PFT's in system. Former smoker. P:   Full mechanical support, wean as able. VAP prevention measures. SBT in AM if able. Albuterol PRN. CXR in AM.  CARDIOVASCULAR A:  Symptomatic thoracic aorta penetrating ulcer - s/p endovascular repair of descending thoracic aorta involving coverage of left subclavian artery 9/15 by Dr. Bridgett Larsson. Hx HLD, HTN, dysrhythmia. P:  Post op management per vascular surgery. Continue outpatient atorvastatin. Hydralazine and Labetalol PRN. Hold outpatient ASA given intra-op bleeding with anemia, losartan and toprol-xl given blood loss / hypovolemia. Check lactate, troponin.  RENAL A:   No acute issues. P:   NS @ 75. Correct electrolytes as indicated. BMP in AM.  GASTROINTESTINAL A:   GERD. Nutrition. P:   SUP: Pantoprazole. NPO.  HEMATOLOGIC A:   AoC anemia - exacerbated by intra-op blood loss. Thrombocytopenia. Coagulopathy. VTE Prophylaxis. P:  H/H q6hrs x 3. Transfuse for Hgb < 7. Monitor platelet counts. Platelet and FFP transfusions ordered by primary. D/c lovenox given blood loss and thrombocytpenia. SCD's. CBC and coags in AM.  INFECTIOUS A:   Leukocytosis -  no indication of infection; likely acute phase reactant. P:   Monitor clinically.  ENDOCRINE A:   Hypothyroidism following thyroidectomy. Hx thyroid CA - s/p thyroidectomy. P:   Continue outpatient synthroid, change to IV. Check TSH.  NEUROLOGIC A:   Acute metabolic encephalopathy. P:   Sedation:  Fentanyl gtt / Midazolam PRN. RASS goal: 0 to -1. Daily WUA.   Family updated: None.  Interdisciplinary Family Meeting v Palliative Care Meeting:  Due by: 9/21.  CC time:  35 minutes.   Montey Hora, [place] [facility] Pager: [phone]  or [phone] 08/19/2015, 8:41 PM

## 2015-08-19 NOTE — Progress Notes (Signed)
Ms Lindsay Blue, Ms Lindsay Park no showed for that appointment 08/11/15, the note you saw for that date was the copied note from our prior visit. Please see the addendum from our 06/21/15 visit, I recommend proceeding with the procedure   Zandra Abts MD

## 2015-08-19 NOTE — Transfer of Care (Signed)
Immediate Anesthesia Transfer of Care Note  Patient: Karelly Scarpelli  Procedure(s) Performed: Procedure(s): THORACIC AORTIC ENDOVASCULAR STENT GRAFT with Right Ilio-femoral bypass graft. (N/A) INTRAVASCULAR ULTRASOUND (N/A)  Patient Location: SICU  Anesthesia Type:General  Level of Consciousness: sedated and Patient remains intubated per anesthesia plan  Airway & Oxygen Therapy: Patient remains intubated per anesthesia plan and Patient placed on Ventilator (see vital sign flow sheet for setting)  Post-op Assessment: Report given to RN and BP 106/51; HR 82 on arrival.  Post vital signs: Reviewed and stable  Last Vitals:  Filed Vitals:   08/19/15 0654  BP: 125/85  Pulse: 80  Temp: 36.2 C  Resp: 18    Complications: No apparent anesthesia complications

## 2015-08-19 NOTE — Op Note (Signed)
    NAME: Lindsay Park    MRN: [id number] DOB: 1929/05/11    DATE OF OPERATION: 08/19/2015  PREOP DIAGNOSIS: Thoracoabdominal aneurysm  POSTOP DIAGNOSIS: Same  PROCEDURE: TEVAR  COSURGEON's : Judeth Cornfield. Scot Dock, MD, Adele Barthel, MD  ASSIST: Gerri Lins PA  ANESTHESIA: Gen.   EBL: per anesthesia record  INDICATIONS: Lindsay Park is a 81 y.o. 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  TECHNIQUE: The first component of the graft was placed as dictated by Dr. Bridgett Larsson. The Lunderquist wire was in good position. The second limb which was a 40 mm x 15 cm Gore devise was advanced through the sheath over the wire and into the initial piece with an overlap of approximately 4 cm. With Dr. Bridgett Larsson holding the sheath in place the second limb was deployed without difficulty and was in excellent position.  The remainder of the dictation is as per Dr. Bridgett Larsson.    Deitra Mayo, MD, FACS [facility]  DATE OF DICTATION:   08/19/2015

## 2015-08-19 NOTE — Anesthesia Procedure Notes (Signed)
Procedure Name: Intubation Date/Time: 08/19/2015 3:38 PM Performed by: Clearnce Sorrel Pre-anesthesia Checklist: Patient identified, Emergency Drugs available, Suction available, Patient being monitored and Timeout performed Patient Re-evaluated:Patient Re-evaluated prior to inductionOxygen Delivery Method: Circle system utilized Preoxygenation: Pre-oxygenation with 100% oxygen Intubation Type: IV induction Ventilation: Mask ventilation without difficulty Laryngoscope Size: Mac and 3 Grade View: Grade I Tube type: Oral Tube size: 7.0 mm Number of attempts: 1 Airway Equipment and Method: Stylet Placement Confirmation: ETT inserted through vocal cords under direct vision,  positive ETCO2 and breath sounds checked- equal and bilateral Secured at: 23 cm Tube secured with: Tape Dental Injury: Teeth and Oropharynx as per pre-operative assessment

## 2015-08-19 NOTE — H&P (Signed)
[facility] [place]  History and Physical Update  The patient was interviewed and re-examined.  The patient's previous History and Physical has been reviewed and is unchanged from my consult on 08/06/15.  There is no change in the plan of care: TEVAR, IVUS.  Adele Barthel, MD [facility] [place] Office: [phone] Pager: [phone]  08/19/2015, 7:28 AM

## 2015-08-20 ENCOUNTER — Encounter (HOSPITAL_COMMUNITY): Payer: Self-pay | Admitting: Vascular Surgery

## 2015-08-20 ENCOUNTER — Inpatient Hospital Stay (HOSPITAL_COMMUNITY): Payer: Medicare (Managed Care)

## 2015-08-20 DIAGNOSIS — E876 Hypokalemia: Secondary | ICD-10-CM

## 2015-08-20 DIAGNOSIS — D62 Acute posthemorrhagic anemia: Secondary | ICD-10-CM

## 2015-08-20 LAB — POCT I-STAT 7, (LYTES, BLD GAS, ICA,H+H)
ACID-BASE DEFICIT: 4 mmol/L — AB (ref 0.0–2.0)
ACID-BASE DEFICIT: 15 mmol/L — AB (ref 0.0–2.0)
Acid-base deficit: 11 mmol/L — ABNORMAL HIGH (ref 0.0–2.0)
BICARBONATE: 21 meq/L (ref 20.0–24.0)
BICARBONATE: 14.6 meq/L — AB (ref 20.0–24.0)
BICARBONATE: 15.9 meq/L — AB (ref 20.0–24.0)
CALCIUM ION: 0.84 mmol/L — AB (ref 1.13–1.30)
Calcium, Ion: 1.09 mmol/L — ABNORMAL LOW (ref 1.13–1.30)
Calcium, Ion: 0.99 mmol/L — ABNORMAL LOW (ref 1.13–1.30)
HCT: 27 % — ABNORMAL LOW (ref 36.0–46.0)
HEMATOCRIT: 14 % — AB (ref 36.0–46.0)
HEMATOCRIT: 30 % — AB (ref 36.0–46.0)
HEMOGLOBIN: 4.8 g/dL — AB (ref 12.0–15.0)
Hemoglobin: 10.2 g/dL — ABNORMAL LOW (ref 12.0–15.0)
Hemoglobin: 9.2 g/dL — ABNORMAL LOW (ref 12.0–15.0)
O2 SAT: 100 %
O2 Saturation: 100 %
O2 Saturation: 100 %
PCO2 ART: 35.7 mmHg (ref 35.0–45.0)
PH ART: 7.087 — AB (ref 7.350–7.450)
PO2 ART: 398 mmHg — AB (ref 80.0–100.0)
POTASSIUM: 4.2 mmol/L (ref 3.5–5.1)
POTASSIUM: 3.6 mmol/L (ref 3.5–5.1)
Patient temperature: 34.7
Potassium: 4.3 mmol/L (ref 3.5–5.1)
SODIUM: 127 mmol/L — AB (ref 135–145)
SODIUM: 134 mmol/L — AB (ref 135–145)
SODIUM: 135 mmol/L (ref 135–145)
TCO2: 22 mmol/L (ref 0–100)
TCO2: 16 mmol/L (ref 0–100)
TCO2: 17 mmol/L (ref 0–100)
pCO2 arterial: 32.6 mmHg — ABNORMAL LOW (ref 35.0–45.0)
pCO2 arterial: 46.7 mmHg — ABNORMAL HIGH (ref 35.0–45.0)
pH, Arterial: 7.401 (ref 7.350–7.450)
pH, Arterial: 7.246 — ABNORMAL LOW (ref 7.350–7.450)
pO2, Arterial: 431 mmHg — ABNORMAL HIGH (ref 80.0–100.0)
pO2, Arterial: 364 mmHg — ABNORMAL HIGH (ref 80.0–100.0)

## 2015-08-20 LAB — TSH: TSH: 21.697 u[IU]/mL — ABNORMAL HIGH (ref 0.350–4.500)

## 2015-08-20 LAB — PREPARE FRESH FROZEN PLASMA
UNIT DIVISION: 0
UNIT DIVISION: 0
Unit division: 0
Unit division: 0

## 2015-08-20 LAB — HEPATIC FUNCTION PANEL
ALK PHOS: 50 U/L (ref 38–126)
ALT: 15 U/L (ref 14–54)
AST: 60 U/L — AB (ref 15–41)
Albumin: 3 g/dL — ABNORMAL LOW (ref 3.5–5.0)
Bilirubin, Direct: <0.1 mg/dL — ABNORMAL LOW (ref 0.1–0.5)
TOTAL PROTEIN: 4.8 g/dL — AB (ref 6.5–8.1)
Total Bilirubin: 0.7 mg/dL (ref 0.3–1.2)

## 2015-08-20 LAB — CBC
HCT: 21 % — ABNORMAL LOW (ref 36.0–46.0)
Hemoglobin: 7.3 g/dL — ABNORMAL LOW (ref 12.0–15.0)
MCH: 29.4 pg (ref 26.0–34.0)
MCHC: 34.8 g/dL (ref 30.0–36.0)
MCV: 84.7 fL (ref 78.0–100.0)
PLATELETS: 120 10*3/uL — AB (ref 150–400)
RBC: 2.48 MIL/uL — ABNORMAL LOW (ref 3.87–5.11)
RDW: 15.9 % — AB (ref 11.5–15.5)
WBC: 16.8 10*3/uL — AB (ref 4.0–10.5)

## 2015-08-20 LAB — PROTIME-INR
INR: 1.57 — AB (ref 0.00–1.49)
Prothrombin Time: 18.8 seconds — ABNORMAL HIGH (ref 11.6–15.2)

## 2015-08-20 LAB — PREPARE PLATELET PHERESIS: UNIT DIVISION: 0

## 2015-08-20 LAB — GLUCOSE, CAPILLARY
GLUCOSE-CAPILLARY: 133 mg/dL — AB (ref 65–99)
Glucose-Capillary: 148 mg/dL — ABNORMAL HIGH (ref 65–99)

## 2015-08-20 LAB — PREPARE RBC (CROSSMATCH)

## 2015-08-20 LAB — MAGNESIUM: Magnesium: 1.3 mg/dL — ABNORMAL LOW (ref 1.7–2.4)

## 2015-08-20 LAB — LACTIC ACID, PLASMA
LACTIC ACID, VENOUS: 2.7 mmol/L — AB (ref 0.5–2.0)
Lactic Acid, Venous: 5.5 mmol/L (ref 0.5–2.0)
Lactic Acid, Venous: 5.4 mmol/L (ref 0.5–2.0)

## 2015-08-20 LAB — BASIC METABOLIC PANEL
ANION GAP: 10 (ref 5–15)
BUN: 10 mg/dL (ref 6–20)
CALCIUM: 7 mg/dL — AB (ref 8.9–10.3)
CO2: 21 mmol/L — ABNORMAL LOW (ref 22–32)
Chloride: 106 mmol/L (ref 101–111)
Creatinine, Ser: 0.79 mg/dL (ref 0.44–1.00)
Glucose, Bld: 153 mg/dL — ABNORMAL HIGH (ref 65–99)
Potassium: 3.3 mmol/L — ABNORMAL LOW (ref 3.5–5.1)
Sodium: 137 mmol/L (ref 135–145)

## 2015-08-20 LAB — PHOSPHORUS: PHOSPHORUS: 4.3 mg/dL (ref 2.5–4.6)

## 2015-08-20 LAB — HEMOGLOBIN AND HEMATOCRIT, BLOOD
HCT: 21 % — ABNORMAL LOW (ref 36.0–46.0)
HEMATOCRIT: 21.3 % — AB (ref 36.0–46.0)
HEMATOCRIT: 35 % — AB (ref 36.0–46.0)
HEMOGLOBIN: 12.1 g/dL (ref 12.0–15.0)
Hemoglobin: 7.1 g/dL — ABNORMAL LOW (ref 12.0–15.0)
Hemoglobin: 7.3 g/dL — ABNORMAL LOW (ref 12.0–15.0)

## 2015-08-20 LAB — BLOOD PRODUCT ORDER (VERBAL) VERIFICATION

## 2015-08-20 LAB — TROPONIN I: Troponin I: 0.04 ng/mL — ABNORMAL HIGH (ref <0.031)

## 2015-08-20 LAB — APTT: aPTT: 37 seconds (ref 24–37)

## 2015-08-20 MED ORDER — FENTANYL CITRATE (PF) 100 MCG/2ML IJ SOLN
12.5000 ug | INTRAMUSCULAR | Status: DC | PRN
Start: 2015-08-20 — End: 2015-08-23
  Administered 2015-08-20 (×2): 25 ug via INTRAVENOUS
  Administered 2015-08-21 – 2015-08-22 (×3): 12.5 ug via INTRAVENOUS
  Filled 2015-08-20 (×3): qty 2

## 2015-08-20 MED ORDER — ANTISEPTIC ORAL RINSE SOLUTION (CORINZ)
7.0000 mL | Freq: Four times a day (QID) | OROMUCOSAL | Status: DC
  Administered 2015-08-20 – 2015-08-22 (×10): 7 mL via OROMUCOSAL

## 2015-08-20 MED ORDER — SODIUM CHLORIDE 0.9 % IV SOLN: INTRAVENOUS | Status: DC

## 2015-08-20 MED ORDER — IOHEXOL 350 MG/ML SOLN
100.0000 mL | Freq: Once | INTRAVENOUS | Status: AC | PRN
  Administered 2015-08-20: 100 mL via INTRAVENOUS

## 2015-08-20 MED ORDER — SODIUM CHLORIDE 0.9 % IV SOLN
Freq: Once | INTRAVENOUS | Status: AC
  Administered 2015-08-20: 10 mL/h via INTRAVENOUS

## 2015-08-20 MED ORDER — CHLORHEXIDINE GLUCONATE 0.12% ORAL RINSE (MEDLINE KIT)
15.0000 mL | Freq: Two times a day (BID) | OROMUCOSAL | Status: DC
  Administered 2015-08-20 – 2015-08-21 (×3): 15 mL via OROMUCOSAL

## 2015-08-20 MED ORDER — FUROSEMIDE 10 MG/ML IJ SOLN
40.0000 mg | Freq: Four times a day (QID) | INTRAMUSCULAR | Status: AC
  Administered 2015-08-20 (×2): 40 mg via INTRAVENOUS
  Filled 2015-08-20 (×2): qty 4

## 2015-08-20 MED ORDER — DEXAMETHASONE SODIUM PHOSPHATE 4 MG/ML IJ SOLN
4.0000 mg | Freq: Four times a day (QID) | INTRAMUSCULAR | Status: AC
Start: 2015-08-20 — End: 2015-08-21
  Administered 2015-08-20 – 2015-08-21 (×4): 4 mg via INTRAVENOUS
  Filled 2015-08-20 (×4): qty 1

## 2015-08-20 MED ORDER — POTASSIUM CHLORIDE 10 MEQ/50ML IV SOLN
10.0000 meq | INTRAVENOUS | Status: AC
  Administered 2015-08-20 (×4): 10 meq via INTRAVENOUS
  Filled 2015-08-20 (×4): qty 50

## 2015-08-20 NOTE — Procedures (Signed)
Extubation Procedure Note  Patient Details:   Name: Lindsay Park DOB: 1929-01-25 MRN: [id number]   Airway Documentation:     Evaluation  O2 sats: stable throughout Complications: No apparent complications Patient did tolerate procedure well. Bilateral Breath Sounds: Clear Suctioning: Airway Yes   Patient extubated to 2L nasal cannula per MD order.  Positive cuff leak noted.  No evidence of stridor.  Patient able to speak post extubation.  Sats currently 99%.  Vitals are stable.  No apparent complications.   Alphia Moh N 08/20/2015, 10:07 AM

## 2015-08-20 NOTE — Progress Notes (Signed)
[facility] PCCM  CT angiogram shows significant amounts of blood in the retroperitoneum and the peritoneal cavity.  There was no sign of active bleeding.  There was no evidence of dead bowel on CT angiogram.  Her respiratory status remains stable post operatively.  Vascular surgery is transfusing blood now Will continue to monitor carefully  Monitor CBC post transfusion  Follow lactic acid  Additional CC time today 30 minutes  Roselie Awkward, MD [place] PCCM Pager: [phone] Cell: [phone] After 3pm or if no response, call [phone]

## 2015-08-20 NOTE — Progress Notes (Addendum)
   Daily Progress Note  Assessment/Planning: POD #1 s/p TEVAR for thoracic PAU complicated by R EIA intimal disruption leading to rupture and RPH   No clear to me why the abd became distended intraop.  The abd appears soft this AM.  Completion aortogram did not demonstrate aortic injury.  The iliac injury was clearly in the R EIA, presenting R RPH, which should not have caused abd distension  UOP has been adequate along with ok airway pressures, so no evidence of abd compartment syndrome.  BP soft and H/H low.  Would repeat H/H  PCCM is going to order screening CTA to evaluate for ischemic bowel and intraperitoneal hematoma.  Subjective  - 1 Day Post-Op  Intubated   Objective Filed Vitals:   08/20/15 0800 08/20/15 0805 08/20/15 0809 08/20/15 1006  BP: 107/63 90/44  90/59  Pulse: 92 109  107  Temp:   97.8 F (36.6 C)   TempSrc:   Oral   Resp: 21 24  23   Height:      Weight:      SpO2: 100% 100%  100%    Intake/Output Summary (Last 24 hours) at 08/20/15 1203 Last data filed at 08/20/15 0600  Gross per 24 hour  Intake   8417 ml  Output   2105 ml  Net   6312 ml   NEURO sleeping PULM  BLL rales Vent Mode:  [-] PSV;CPAP FiO2 (%):  [40 %-50 %] 40 % Set Rate:  [14 bmp-16 bmp] 14 bmp Vt Set:  [370 mL] 370 mL PEEP:  [5 cmH20] 5 cmH20 Pressure Support:  [5 cmH20-15 cmH20] 5 cmH20 Plateau Pressure:  [22 cmH20-23 cmH20] 22 cmH20 ABG    Component Value Date/Time   PHART 7.309* 08/19/2015 2135   PCO2ART 35.4 08/19/2015 2135   PO2ART 116* 08/19/2015 2135   HCO3 17.3* 08/19/2015 2135   TCO2 18.3 08/19/2015 2135   ACIDBASEDEF 7.8* 08/19/2015 2135   O2SAT 98.5 08/19/2015 2135   CV  RRR GI  soft, distending, mild diffuse TTP, -G/R VASC  B groin bandage, palpable DP B  Laboratory CBC    Component Value Date/Time   WBC 16.8* 08/20/2015 0308   HGB 7.1* 08/20/2015 1026   HCT 21.0* 08/20/2015 1026   PLT 120* 08/20/2015 0308    BMET    Component Value Date/Time   NA 137 08/20/2015 0308   K 3.3* 08/20/2015 0308   CL 106 08/20/2015 0308   CO2 21* 08/20/2015 0308   GLUCOSE 153* 08/20/2015 0308   BUN 10 08/20/2015 0308   CREATININE 0.79 08/20/2015 0308   CALCIUM 7.0* 08/20/2015 0308   GFRNONAA >60 08/20/2015 0308   GFRAA >60 08/20/2015 0308    Adele Barthel, MD [facility] [place] Office: [phone] Pager: [phone]  08/20/2015, 12:03 PM

## 2015-08-20 NOTE — Progress Notes (Signed)
Daily Progress Note  Radiology: Portable Chest Xray  08/20/2015   CLINICAL DATA:  Hypoxia  EXAM: PORTABLE CHEST - 1 VIEW  COMPARISON:  August 19, 2015  FINDINGS: Endotracheal tube tip is 2.4 cm above the carina. Central catheter tip is in the superior vena cava. A nasogastric tube extends into the stomach with the tip and side-port directed superiorly. The tip of the nasogastric tube is in the distal esophagus. No pneumothorax.  There is atelectasis in left lower lobe. Lungs otherwise clear. Heart is mildly enlarged but stable. Aortic stent graft is present. Pulmonary vascularity is normal. No adenopathy.  IMPRESSION: Tube and catheter positions as described. Note that the nasogastric tube extends into the stomach where the tube tip and side port extends superiorly with the tip residing in the distal esophagus. Suggest advancing nasogastric tube approximately 8-10 cm to cause tip and side-port to reside in the stomach. Left lower lobe atelectasis. Aortic stent graft present.  These results will be called to the ordering clinician or representative by the Radiologist Assistant, and communication documented in the PACS or zVision Dashboard.   Electronically Signed   By: Lowella Grip III M.D.   On: 08/20/2015 08:06   Dg Chest Port 1 View  08/20/2015   CLINICAL DATA:  81 year old female status post aortic aneurysm repair  EXAM: PORTABLE CHEST - 1 VIEW  COMPARISON:  Radiograph dated 07/15/2015 and chest CT dated 06/10/2015  FINDINGS: Single-view of the chest demonstrates interval placement of an endovascular stent graft in the descending thoracic aorta. No significant fluid collection identified. Stable cardiac silhouette. There is mild diffuse haziness of the left lung, likely atelectatic changes. Pneumonia is not excluded. An endotracheal tube remains above the carina. There is degenerative changes of the spine.  IMPRESSION: Interval placement of an endovascular stent graft in the descending thoracic  aorta.   Electronically Signed   By: Anner Crete M.D.   On: 08/20/2015 00:51   Dg Abd Portable 1v  08/20/2015   CLINICAL DATA:  Acute onset of abdominal distension. Postop endovascular repair of descending thoracic aortic aneurysm 08/19/2015.  EXAM: PORTABLE ABDOMEN - 1 VIEW  COMPARISON:  CTA abdomen and pelvis 06/10/2015.  FINDINGS: Bowel gas pattern unremarkable without evidence of obstruction or significant ileus. Expected stool burden in the colon. Residual contrast material in the urinary tract related to the endovascular stent graft placement. Nasogastric tube looped in the stomach. Descending thoracic aortic stent graft.  IMPRESSION: No acute abdominal abnormality. Specifically, no evidence of postoperative ileus.   Electronically Signed   By: Evangeline Dakin M.D.   On: 08/20/2015 09:23   Ct Angio Abd/pel W/ And/or W/o  08/20/2015   CLINICAL DATA:  Status post endo graft repair of thoracic aortic aneurysm.  EXAM: CTA ABDOMEN AND PELVIS wITHOUT AND WITH CONTRAST  TECHNIQUE: Multidetector CT imaging of the abdomen and pelvis was performed using the standard protocol during bolus administration of intravenous contrast. Multiplanar reconstructed images and MIPs were obtained and reviewed to evaluate the vascular anatomy.  CONTRAST:  115mL OMNIPAQUE IOHEXOL 350 MG/ML SOLN  COMPARISON:  CT scan of June 10, 2015.  FINDINGS: Moderate bilateral pleural effusions are noted with adjacent subsegmental atelectasis. No significant osseous abnormality is noted.  The liver, spleen and pancreas appear normal. Patient is status post placement of descending thoracic aortic stent graft. Suprarenal abdominal aortic aneurysm is noted which measures 4.0 cm in maximum AP diameter. 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 Superior mesenteric and  renal arteries are widely patent. Inferior mesenteric artery is patent. There is a large amount of hemorrhage in the right retroperitoneal region which  displaces the right kidney anteriorly. Hemorrhage also appears to be present within the peritoneal space. No active extravasation is seen at this time. Postsurgical changes are noted in the right inguinal region. Foley catheter is noted within the urinary bladder. There is no evidence of bowel obstruction. Contrast is noted in the gallbladder, but no stones are noted.  Review of the MIP images confirms the above findings.  IMPRESSION: Status post stent graft placement involving descending thoracic aorta.  Moderate bilateral pleural effusions are noted with adjacent atelectasis.  4 cm suprarenal abdominal aortic aneurysm is noted. 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  There is a large amount of hemorrhage seen in the right retroperitoneal and peritoneal regions, which displaces the right kidney anteriorly. No active extravasation is seen at this time. Critical Value/emergent results were called by telephone at the time of interpretation on 08/20/2015 at 2:28 pm to Dr. Simonne Maffucci , who verbally acknowledged these results.   Electronically Signed   By: Marijo Conception, M.D.   On: 08/20/2015 14:29    - Reviewed the CTA: no active extravasation, all vascular structure in abdomen open, stent graft distally patent, no obvious ischemic bowel, large RPH/intraperitoneal hematoma.  Not certain if R RPH decompressed intraperitoneal.  There was no evidence of perforation of aorta with dilator that might even remotely lead to direct communication with the peritoneum.  - Continue with resuscitation.  No active bleeding evident.  No clear if under resuscitation over the night vs. Hemodilution (pt only 80 lb)  - Appreciate PCCM's assistance  - UOP has been excellent so far today (7 am to 5 pm: 1475 cc), suggesting adequate visceral perfusion so I'm not certain why the lactate is elevated.   Adele Barthel, MD [facility] [place] Office: [phone] Pager:  [phone]  08/20/2015, 4:23 PM

## 2015-08-20 NOTE — Evaluation (Signed)
Clinical/Bedside Swallow Evaluation Patient Details  Name: Lindsay Park MRN: [id number] Date of Birth: [birth year]  Today's Date: 08/20/2015 Time: SLP Start Time (ACUTE ONLY): 53 SLP Stop Time (ACUTE ONLY): 1440 SLP Time Calculation (min) (ACUTE ONLY): 25 min  Past Medical History:  Past Medical History  Diagnosis Date  . Hypercholesteremia     takes Atorvastatin daily  . CHF (congestive heart failure)   . Anemia   . COPD (chronic obstructive pulmonary disease)   . Shortness of breath dyspnea     with aneurysm  . GERD (gastroesophageal reflux disease)     takes Pepcid daily  . Hypothyroidism     takes Synthroid daily  . Hypertension     takes Losartan and Metoprolol daily  . 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 Dysrhythmia   . Pneumonia 2015    treated from home, not hosp.   Marland Kitchen Hyperglycemia     treated with oral meds., no insulin, several yrs. ago & since then has been resolved.   . Degenerative disc disease, lumbar     back, knees   . Cancer     thyroid Ca- has had removal  . PONV (postoperative nausea and vomiting)     daughter remarks that her throat swelled after surgery- Aug. 2016, that it was said that they would "use a different medicine"   Past Surgical History:  Past Surgical History  Procedure Laterality Date  . Abdominal hysterectomy    . Stomach surgery      tumor removal from abdomen (benign)  . Thyroidectomy  [place]  . Cardiac catheterization      14 years ago  . Eye surgery      Nov 2015and Jan 2016 [place], [place]  . Carotid-subclavian bypass graft Left 07/14/2015    Procedure: LEFT SUBCLAVIAN to CAROTID ARTERY TRANSPOSITION ;  Surgeon: Conrad Gadsden, MD;  Location: [facility];  Service: Vascular;  Laterality: Left;  . Thoracic aortic endovascular stent graft N/A 08/19/2015    Procedure: THORACIC AORTIC ENDOVASCULAR STENT GRAFT with Right Ilio-femoral bypass graft.;  Surgeon: Conrad Garland, MD;  Location[facility] OR;  Service: Vascular;   Laterality: N/A;   HPI:  Patient is an 81 y.o. female admitted 9/15 for endovascular repair of descending thoracic aorta ulcer. Case complicated by rupture of distal right external iliac artery with intimal disruption which was then repaired with a right iliofemoral bypass. Patient was intubated for surgery and extubated on 9/16 at 7989 with no complications. Patient is currently on O2 via [place].    Assessment / Plan / Recommendation Clinical Impression  Patient presents with a mild oral dysphagia characterized by decreased bolus manipulation and transit. Patient did not exhibit any overt s/s of aspiration or penetration with thin liquids via cup and straw, or with puree solids. Patient declined graham cracker, saying "I don't want to eat anything". Patient exhibited one instance of SP02 dropping below 90% after taking successive sips of thin liquids, but was able to increase SPO2 to 96% with a couple cued breaths through nasal cannula.    Aspiration Risk  Mild    Diet Recommendation Dysphagia 2 (Fine chop);Thin   Medication Administration: Whole meds with puree Compensations: Small sips/bites;Slow rate    Other  Recommendations Oral Care Recommendations: Oral care BID   Follow Up Recommendations       Frequency and Duration min 2x/week  2 weeks   Pertinent Vitals/Pain  SLP Swallow Goals  1. Patient will tolerate least restrictive oral diet with no overt s/s aspiration.  2. Patient will tolerate trials of upgraded solid textures with SLP.   Swallow Study Prior Functional Status       General Date of Onset: 08/19/15 Other Pertinent Information: Patient is an 81 y.o. female admitted 9/15 for endovascular repair of descending thoracic aorta ulcer. Case complicated by rupture of distal right external iliac artery with intimal disruption which was then repaired with a right iliofemoral bypass. Patient was intubated for surgery and extubated on 9/16 at 8938 with no  complications. Patient is currently on O2 via [place].  Type of Study: Bedside swallow evaluation Previous Swallow Assessment: N/A Diet Prior to this Study: NPO Temperature Spikes Noted: No Respiratory Status: Supplemental O2 delivered via (comment) (nasal cannula) History of Recent Intubation: Yes Length of Intubations (days): 1 days Date extubated: 08/20/15 Behavior/Cognition: Alert;Cooperative;Other (Comment) (anxious) Oral Cavity - Dentition: Poor condition;Missing dentition (missing half of bottom row of teeth, has a few upper teeth, no dentures) Self-Feeding Abilities: Needs assist Patient Positioning: Upright in bed Baseline Vocal Quality: Low vocal intensity Volitional Cough: Weak Volitional Swallow: Able to elicit    Oral/Motor/Sensory Function Overall Oral Motor/Sensory Function: Appears within functional limits for tasks assessed   Ice Chips Ice chips: Not tested   Thin Liquid Thin Liquid: Within functional limits Presentation: Cup;Straw Other Comments: No overt s/s aspiration with thin liquids via straw and cup sips    Nectar Thick Nectar Thick Liquid: Not tested   Honey Thick Honey Thick Liquid: Not tested   Puree Puree: Impaired Presentation: Spoon Oral Phase Impairments: Impaired anterior to posterior transit Oral Phase Functional Implications: Prolonged oral transit Other Comments: No residuals in oral cavity post-swallows and no overt s/s/ aspiration    Solid   GO    Solid: Not tested (patient declined)       Nadara Mode Tarrell 08/20/2015,3:02 PM  Sonia Baller, [place], [place] 08/20/2015 3:03 PM Phone: [phone] Fax: [phone]

## 2015-08-20 NOTE — Progress Notes (Signed)
Dr. Bridgett Larsson to bedside, updated on pt status. Notified of lab values, will continue to monitor.

## 2015-08-20 NOTE — Progress Notes (Signed)
CRITICAL VALUE ALERT  Critical value received: lactic acid  Date of notification:  08/20/15  Time of notification:  2811  Critical value read back:Yes.    Nurse who received alert:  Sula Soda  MD notified (1st page):  Dr. Lake Bells  Time of first page:  1056  MD notified (2nd page):na  Time of second page:na  Responding MD:  Lake Bells   Time MD responded:  1057

## 2015-08-20 NOTE — Progress Notes (Signed)
PULMONARY / CRITICAL CARE MEDICINE   Name: Lindsay Park MRN: [id number] DOB: [birth year]    ADMISSION DATE:  08/19/2015 CONSULTATION DATE:  08/20/2015  REFERRING MD :  Bridgett Larsson  CHIEF COMPLAINT:  Vent management  INITIAL PRESENTATION:  81 y.o. F admitted 9/15 for endovascular repair of descending thoracic aorta ulcer.  Case complicated by rupture of distal right external iliac artery with intimal disruption which was then repaired with a right iliofemoral bypass.  Following the case, pt remained on the ventilator and PCCM called for vent management.    STUDIES:  None.  SIGNIFICANT EVENTS: 9/15 OR for endovascular repair of descending thoracic aorta involving coverage of left subclavian artery > complicated by rupture of distal right external iliac artery with intimal disruption.  This was repaired with a right iliofemoral bypass    SUBJECTIVE:  Surgery yesterday, weaning this morning  VITAL SIGNS: Temp:  [95.1 F (35.1 C)-98.3 F (36.8 C)] 97.8 F (36.6 C) (09/16 0809) Pulse Rate:  [67-109] 109 (09/16 0805) Resp:  [13-31] 24 (09/16 0805) BP: (90-140)/(44-74) 90/44 mmHg (09/16 0805) SpO2:  [99 %-100 %] 100 % (09/16 0805) Arterial Line BP: (84-137)/(51-71) 109/53 mmHg (09/16 0700) FiO2 (%):  [40 %-50 %] 40 % (09/16 0805) HEMODYNAMICS:   VENTILATOR SETTINGS: Vent Mode:  [-] PSV;CPAP FiO2 (%):  [40 %-50 %] 40 % Set Rate:  [14 bmp-16 bmp] 14 bmp Vt Set:  [370 mL] 370 mL PEEP:  [5 cmH20] 5 cmH20 Pressure Support:  [15 cmH20] 15 cmH20 Plateau Pressure:  [22 cmH20-23 cmH20] 22 cmH20 INTAKE / OUTPUT: Intake/Output      09/15 0701 - 09/16 0700 09/16 0701 - 09/17 0700   I.V. (mL/kg) 5390 (125.5)    Blood 2527    IV Piggyback 500    Total Intake(mL/kg) 8417 (196)    Urine (mL/kg/hr) 905 (0.9)    Blood 1200 (1.2)    Total Output 2105     Net +6312            PHYSICAL EXAMINATION: General: agitated on vent HENT: NCAT, ETT in place PULM: few rhonchi bilaterally CV: RRR, no  mgr GI: BS+, sof,t nontender MSK: normal bulk and tone Neuro: somewhat agitated but able to be redirected easily  LABS:  CBC  Recent Labs Lab 08/19/15 1933 08/19/15 2050 08/19/15 2150 08/20/15 0308  WBC 14.9* 23.4*  --  16.8*  HGB 10.8* 9.2* 10.2* 7.3*  HCT 33.1* 27.5* 30.0* 21.0*  PLT 71* 69*  --  120*   Coag's  Recent Labs Lab 08/19/15 1933 08/19/15 2050 08/20/15 0308  APTT 59* >200* 37  INR 2.65* 2.70* 1.57*   BMET  Recent Labs Lab 08/19/15 2050 08/20/15 0308  NA 138 137  K 3.5 3.3*  CL 111 106  CO2 19* 21*  BUN 9 10  CREATININE 0.69 0.79  GLUCOSE 179* 153*   Electrolytes  Recent Labs Lab 08/19/15 2050 08/20/15 0308  CALCIUM 6.7* 7.0*  MG 1.1*  --    Sepsis Markers  Recent Labs Lab 08/19/15 2150  LATICACIDVEN 4.4*   ABG  Recent Labs Lab 08/19/15 2135  PHART 7.309*  PCO2ART 35.4  PO2ART 116*   Liver Enzymes No results for input(s): AST, ALT, ALKPHOS, BILITOT, ALBUMIN in the last 168 hours. Cardiac Enzymes  Recent Labs Lab 08/19/15 2150  TROPONINI 0.08*   Glucose  Recent Labs Lab 08/19/15 2341 08/20/15 0346  GLUCAP 181* 133*    Imaging 9/16 CXR images personally reviewed> ETT in place, bilateral hazy opacities  slightly increased today    ASSESSMENT / PLAN:  PULMONARY OETT 9/15 >>> A: VDRF following endovascular repair of descending thoracic aorta 9/15 > improving Failed extubation initially in 07/2015 in OR> ?upper airway issue > was successfully extubated without clear laryngeal pathology on 8/11 Acute Pulmonary edema 9/16 > mild, due to volume resuscitation Hx COPD by report - no PFT's in system Former smoker P:   PSV this morning> monitor carefully Will need to check cuff leak prior to extubation Empiric decadron for 24 hours given last month's episode May be able to extubate today  CARDIOVASCULAR A:  Symptomatic thoracic aorta penetrating ulcer - s/p endovascular repair of descending thoracic aorta  involving coverage of left subclavian artery 9/15 by Dr. Bridgett Larsson Hx HLD, HTN, dysrhythmia Lactic acid and troponin both mildly elevated post op 9/16, suspect related to stress of surgery P:  Post op management per vascular surgery. Continue outpatient atorvastatin Hydralazine and Labetalol PRN Hold outpatient ASA given intra-op bleeding with anemia, losartan and toprol-xl given blood loss / hypovolemia Check 12 lead, repeat lactic acid and troponin  RENAL A:   Hypokalemia P:   Hold IVF Monitor BMET and UOP Replace electrolytes as needed   GASTROINTESTINAL A:   GERD Nutrition P:   SUP: Pantoprazole NPO  HEMATOLOGIC A:   Anemia - exacerbated by intra-op blood loss Thrombocytopenia Coagulopathy VTE Prophylaxis P:  Transfuse for Hgb < 7 Monitor for bleeding  INFECTIOUS A:   Leukocytosis - improving, likely stress response post operatively no indication of infection; P:   Monitor for fever   ENDOCRINE A:   Hypothyroidism following thyroidectomy Hx thyroid CA - s/p thyroidectomy P:   Continue outpatient synthroid, change to po when taking po  NEUROLOGIC A:   Acute metabolic encephalopathy > improving P:   Sedation:  Fentanyl gtt / Midazolam PRN RASS goal: 0 to -1 Daily WUA   Family updated: Daughter Sharyn Lull updated 9/16 bedside.  Interdisciplinary Family Meeting v Palliative Care Meeting:  Due by: 9/21.  CC time:  35 minutes.  Roselie Awkward, MD [place] PCCM Pager: [phone] Cell: [phone] After 3pm or if no response, call [phone]

## 2015-08-20 NOTE — Progress Notes (Signed)
Dr. Nelda Marseille notified of PTT and Lactic acid results, will continue to monitor.

## 2015-08-20 NOTE — Clinical Documentation Improvement (Signed)
Vascular Surgery  Can the diagnosis of anemia be further specified?   Iron deficiency Anemia  Acute Blood Loss Anemia, including the suspected or known cause  Nutritional anemia, including the nutrition or mineral deficits  Acute on Chronic Blood Loss Anemia, including the suspected or known cause  Anemia of chronic disease, including the associated chronic disease state  Other  Clinically Undetermined  Document any associated diagnoses/conditions.   Supporting Information: Anemia - exacerbated by intra-op blood loss -Thrombocytopenia -Coagulopathy -VTE Prophylaxis  -P:Transfuse for Hgb < 7Monitor for bleeding per 9/16 progress notes. Transfuse 4 units PRBC at the end of case due to blood loss per 9/16 progress notes. 9/15: EBL: 1200 ml per Anesthesia record.  Labs: H/H: 9/16:  7.1/21.0. 9/15: 10.8/33.1.    Please exercise your independent, professional judgment when responding. A specific answer is not anticipated or expected.   Thank You,  [place] [phone]

## 2015-08-20 NOTE — Progress Notes (Signed)
No Rapid wean protocol in for this patient. RT will continue to monitor.

## 2015-08-20 NOTE — Progress Notes (Signed)
CRITICAL VALUE ALERT  Critical value received: Lactic Acid 5.4  Date of notification:  9/16   Time of notification:  0263   Critical value read back:Yes.    Nurse who received alert:  Sula Soda  MD notified (1st page):  McQuaid  Time of first page:  [phone]   MD notified (2nd page):  Time of second page:  Responding MD:  Lake Bells  Time MD responded:  [phone]

## 2015-08-20 NOTE — Progress Notes (Signed)
[facility] P[facility]M  Ms. Benning has been complaining of more abdominal pain and distension this morning.  She was successfully extubated.  Lactic acid noted to be elevated compared to last night.  Hgb 7.1.  Discussed with Dr. Bridgett Larsson  Plan: Check LFT Check CT abdomen/pelvis STAT given rising WBC, lack of bowel sounds, check for RP bleed and ischemic bowel  Roselie Awkward, MD [facility] Pager: [phone] Cell: [phone] After 3pm or if no response, call [phone]

## 2015-08-21 LAB — BASIC METABOLIC PANEL
ANION GAP: 8 (ref 5–15)
BUN: 13 mg/dL (ref 6–20)
CALCIUM: 7.8 mg/dL — AB (ref 8.9–10.3)
CO2: 26 mmol/L (ref 22–32)
Chloride: 104 mmol/L (ref 101–111)
Creatinine, Ser: 0.95 mg/dL (ref 0.44–1.00)
GFR calc Af Amer: >60 mL/min (ref >60)
GFR, EST NON AFRICAN AMERICAN: 53 mL/min — AB (ref >60)
GLUCOSE: 144 mg/dL — AB (ref 65–99)
Potassium: 3.3 mmol/L — ABNORMAL LOW (ref 3.5–5.1)
SODIUM: 138 mmol/L (ref 135–145)

## 2015-08-21 LAB — CBC WITH DIFFERENTIAL/PLATELET
BASOS ABS: 0 10*3/uL (ref 0.0–0.1)
BASOS PCT: 0 %
EOS ABS: 0 10*3/uL (ref 0.0–0.7)
EOS PCT: 0 %
HCT: 31.1 % — ABNORMAL LOW (ref 36.0–46.0)
Hemoglobin: 11 g/dL — ABNORMAL LOW (ref 12.0–15.0)
Lymphocytes Relative: 4 %
Lymphs Abs: 0.6 10*3/uL — ABNORMAL LOW (ref 0.7–4.0)
MCH: 28.6 pg (ref 26.0–34.0)
MCHC: 35.4 g/dL (ref 30.0–36.0)
MCV: 81 fL (ref 78.0–100.0)
MONO ABS: 1.5 10*3/uL — AB (ref 0.1–1.0)
Monocytes Relative: 9 %
Neutro Abs: 14 10*3/uL — ABNORMAL HIGH (ref 1.7–7.7)
Neutrophils Relative %: 87 %
PLATELETS: 54 10*3/uL — AB (ref 150–400)
RBC: 3.84 MIL/uL — ABNORMAL LOW (ref 3.87–5.11)
RDW: 17.6 % — AB (ref 11.5–15.5)
WBC: 16 10*3/uL — ABNORMAL HIGH (ref 4.0–10.5)

## 2015-08-21 LAB — TYPE AND SCREEN
ABO/RH(D): O POS
ANTIBODY SCREEN: NEGATIVE
UNIT DIVISION: 0
UNIT DIVISION: 0
UNIT DIVISION: 0
Unit division: 0
Unit division: 0
Unit division: 0
Unit division: 0

## 2015-08-21 LAB — PREPARE FRESH FROZEN PLASMA
Unit division: 0
Unit division: 0
Unit division: 0

## 2015-08-21 LAB — LACTIC ACID, PLASMA
LACTIC ACID, VENOUS: 1.4 mmol/L (ref 0.5–2.0)
LACTIC ACID, VENOUS: 1.1 mmol/L (ref 0.5–2.0)

## 2015-08-21 MED ORDER — POTASSIUM CHLORIDE 10 MEQ/50ML IV SOLN
10.0000 meq | INTRAVENOUS | Status: AC
  Administered 2015-08-21 (×2): 10 meq via INTRAVENOUS
  Filled 2015-08-21 (×2): qty 50

## 2015-08-21 MED ORDER — PANTOPRAZOLE SODIUM 40 MG PO TBEC
40.0000 mg | DELAYED_RELEASE_TABLET | Freq: Every day | ORAL | Status: DC
  Administered 2015-08-21 – 2015-08-24 (×4): 40 mg via ORAL
  Filled 2015-08-21 (×4): qty 1

## 2015-08-21 MED ORDER — LEVOTHYROXINE SODIUM 100 MCG PO TABS
100.0000 ug | ORAL_TABLET | Freq: Every day | ORAL | Status: DC
  Administered 2015-08-21 – 2015-08-24 (×4): 100 ug via ORAL
  Filled 2015-08-21 (×5): qty 1

## 2015-08-21 NOTE — Progress Notes (Signed)
PULMONARY / CRITICAL CARE MEDICINE   Name: Lindsay Park MRN: [id number] DOB: [birth year]    ADMISSION DATE:  08/19/2015 CONSULTATION DATE:  08/21/2015  REFERRING MD :  Bridgett Larsson  CHIEF COMPLAINT:  Vent management  INITIAL PRESENTATION:  81 y.o. F admitted 9/15 for endovascular repair of descending thoracic aorta ulcer.  Case complicated by rupture of distal right external iliac artery with intimal disruption which was then repaired with a right iliofemoral bypass.  Following the case, pt remained on the ventilator and PCCM called for vent management. Subsequent course c/b retroperitoneal hemorrhage   STUDIES:  CT abd / pelvis 9/16 >> aortic stent graft, moderate B effusions, large R retroperitoneal and peritoneal bleed without active extravasation  SIGNIFICANT EVENTS: 9/15 OR for endovascular repair of descending thoracic aorta involving coverage of left subclavian artery > complicated by rupture of distal right external iliac artery with intimal disruption.  This was repaired with a right iliofemoral bypass   SUBJECTIVE / Interval events:  Note Ct scan abd results from 9/16, intra-abd and retroperitoneal blood Diet ordered but hasn't been hungry yet   VITAL SIGNS: Temp:  [97.7 F (36.5 C)-100.5 F (38.1 C)] 99.7 F (37.6 C) (09/17 0400) Pulse Rate:  [25-126] 99 (09/17 0700) Resp:  [18-35] 25 (09/17 0700) BP: (90-163)/(44-108) 149/83 mmHg (09/17 0700) SpO2:  [73 %-100 %] 96 % (09/17 0700) Arterial Line BP: (103-158)/(48-83) 134/62 mmHg (09/16 2300) FiO2 (%):  [40 %] 40 % (09/16 0905) Weight:  [49.7 kg (109 lb 9.1 oz)] 49.7 kg (109 lb 9.1 oz) (09/17 0100) HEMODYNAMICS:   VENTILATOR SETTINGS: Vent Mode:  [-] PSV;CPAP FiO2 (%):  [40 %] 40 % PEEP:  [5 cmH20] 5 cmH20 Pressure Support:  [5 cmH20-15 cmH20] 5 cmH20 INTAKE / OUTPUT: Intake/Output      09/16 0701 - 09/17 0700 09/17 0701 - 09/18 0700   I.V. (mL/kg) 361.4 (7.3)    Blood 1010    IV Piggyback 300    Total Intake(mL/kg)  1671.4 (33.6)    Urine (mL/kg/hr) 4185 (3.5)    Blood     Total Output 4185     Net -2513.6            PHYSICAL EXAMINATION: General: elderly woman, NAD in bed HENT: NCAT, poor dentition,  PULM: decreased based, no wheeze or crackles CV: RRR, no mgr GI: BS+, slightly firm, sore to palp on R, L less so MSK: normal bulk and tone Neuro: awake and interacting, oriented to self, place but not situation - she can't recall what sgy she had  LABS:  CBC  Recent Labs Lab 08/19/15 2050  08/20/15 0308  08/20/15 1220 08/20/15 2250 08/21/15 0530  WBC 23.4*  --  16.8*  --   --   --  16.0*  HGB 9.2*  < > 7.3*  < > 7.3* 12.1 11.0*  HCT 27.5*  < > 21.0*  < > 21.3* 35.0* 31.1*  PLT 69*  --  120*  --   --   --  54*  < > = values in this interval not displayed. Coag's  Recent Labs Lab 08/19/15 1933 08/19/15 2050 08/20/15 0308  APTT 59* >200* 37  INR 2.65* 2.70* 1.57*   BMET  Recent Labs Lab 08/19/15 2050 08/20/15 0308 08/21/15 0530  NA 138 137 138  K 3.5 3.3* 3.3*  CL 111 106 104  CO2 19* 21* 26  BUN 9 10 13   CREATININE 0.69 0.79 0.95  GLUCOSE 179* 153* 144*   Electrolytes  Recent Labs Lab 08/19/15 2050 08/20/15 0308 08/20/15 1026 08/21/15 0530  CALCIUM 6.7* 7.0*  --  7.8*  MG 1.1*  --  1.3*  --   PHOS  --   --  4.3  --    Sepsis Markers  Recent Labs Lab 08/20/15 2250 08/21/15 0314 08/21/15 0530  LATICACIDVEN 2.7* 1.4 1.1   ABG  Recent Labs Lab 08/19/15 1815 08/19/15 1920 08/19/15 2135  PHART 7.087* 7.246* 7.309*  PCO2ART 46.7* 35.7 35.4  PO2ART 398.0* 364.0* 116*   Liver Enzymes  Recent Labs Lab 08/20/15 1206  AST 60*  ALT 15  ALKPHOS 50  BILITOT 0.7  ALBUMIN 3.0*   Cardiac Enzymes  Recent Labs Lab 08/19/15 2150 08/20/15 1026  TROPONINI 0.08* 0.04*   Glucose  Recent Labs Lab 08/19/15 2341 08/20/15 0346 08/20/15 0808  GLUCAP 181* 133* 148*    Portable Chest Xray  08/20/2015   CLINICAL DATA:  Hypoxia  EXAM: PORTABLE  CHEST - 1 VIEW  COMPARISON:  August 19, 2015  FINDINGS: Endotracheal tube tip is 2.4 cm above the carina. Central catheter tip is in the superior vena cava. A nasogastric tube extends into the stomach with the tip and side-port directed superiorly. The tip of the nasogastric tube is in the distal esophagus. No pneumothorax.  There is atelectasis in left lower lobe. Lungs otherwise clear. Heart is mildly enlarged but stable. Aortic stent graft is present. Pulmonary vascularity is normal. No adenopathy.  IMPRESSION: Tube and catheter positions as described. Note that the nasogastric tube extends into the stomach where the tube tip and side port extends superiorly with the tip residing in the distal esophagus. Suggest advancing nasogastric tube approximately 8-10 cm to cause tip and side-port to reside in the stomach. Left lower lobe atelectasis. Aortic stent graft present.  These results will be called to the ordering clinician or representative by the Radiologist Assistant, and communication documented in the PACS or zVision Dashboard.   Electronically Signed   By: Lowella Grip III M.D.   On: 08/20/2015 08:06   Dg Chest Port 1 View  08/20/2015   CLINICAL DATA:  81 year old female status post aortic aneurysm repair  EXAM: PORTABLE CHEST - 1 VIEW  COMPARISON:  Radiograph dated 07/15/2015 and chest CT dated 06/10/2015  FINDINGS: Single-view of the chest demonstrates interval placement of an endovascular stent graft in the descending thoracic aorta. No significant fluid collection identified. Stable cardiac silhouette. There is mild diffuse haziness of the left lung, likely atelectatic changes. Pneumonia is not excluded. An endotracheal tube remains above the carina. There is degenerative changes of the spine.  IMPRESSION: Interval placement of an endovascular stent graft in the descending thoracic aorta.   Electronically Signed   By: Anner Crete M.D.   On: 08/20/2015 00:51   Dg Abd Portable  1v  08/20/2015   CLINICAL DATA:  Acute onset of abdominal distension. Postop endovascular repair of descending thoracic aortic aneurysm 08/19/2015.  EXAM: PORTABLE ABDOMEN - 1 VIEW  COMPARISON:  CTA abdomen and pelvis 06/10/2015.  FINDINGS: Bowel gas pattern unremarkable without evidence of obstruction or significant ileus. Expected stool burden in the colon. Residual contrast material in the urinary tract related to the endovascular stent graft placement. Nasogastric tube looped in the stomach. Descending thoracic aortic stent graft.  IMPRESSION: No acute abdominal abnormality. Specifically, no evidence of postoperative ileus.   Electronically Signed   By: Evangeline Dakin M.D.   On: 08/20/2015 09:23   Ct Angio Abd/pel W/ And/or W/o  08/20/2015   CLINICAL DATA:  Status post endo graft repair of thoracic aortic aneurysm.  EXAM: CTA ABDOMEN AND PELVIS wITHOUT AND WITH CONTRAST  TECHNIQUE: Multidetector CT imaging of the abdomen and pelvis was performed using the standard protocol during bolus administration of intravenous contrast. Multiplanar reconstructed images and MIPs were obtained and reviewed to evaluate the vascular anatomy.  CONTRAST:  144mL OMNIPAQUE IOHEXOL 350 MG/ML SOLN  COMPARISON:  CT scan of June 10, 2015.  FINDINGS: Moderate bilateral pleural effusions are noted with adjacent subsegmental atelectasis. No significant osseous abnormality is noted.  The liver, spleen and pancreas appear normal. Patient is status post placement of descending thoracic aortic stent graft. Suprarenal abdominal aortic aneurysm is noted which measures 4.0 cm in maximum AP diameter. 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 Superior mesenteric and renal arteries are widely patent. Inferior mesenteric artery is patent. There is a large amount of hemorrhage in the right retroperitoneal region which displaces the right kidney anteriorly. Hemorrhage also appears to be present within the peritoneal space. No  active extravasation is seen at this time. Postsurgical changes are noted in the right inguinal region. Foley catheter is noted within the urinary bladder. There is no evidence of bowel obstruction. Contrast is noted in the gallbladder, but no stones are noted.  Review of the MIP images confirms the above findings.  IMPRESSION: Status post stent graft placement involving descending thoracic aorta.  Moderate bilateral pleural effusions are noted with adjacent atelectasis.  4 cm suprarenal abdominal aortic aneurysm is noted. 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  There is a large amount of hemorrhage seen in the right retroperitoneal and peritoneal regions, which displaces the right kidney anteriorly. No active extravasation is seen at this time. Critical Value/emergent results were called by telephone at the time of interpretation on 08/20/2015 at 2:28 pm to Dr. Simonne Maffucci , who verbally acknowledged these results.   Electronically Signed   By: Marijo Conception, M.D.   On: 08/20/2015 14:29    ASSESSMENT / PLAN:  PULMONARY OETT 9/15 >>> 9/16 A: VDRF following endovascular repair of descending thoracic aorta 9/15, resolved Hx Failed extubation initially in 07/2015 in OR> ?upper airway issue > was successfully extubated without clear laryngeal pathology on 8/11 Acute Pulmonary edema 9/16 > mild, due to volume resuscitation Hx COPD by report - no PFT's in system Former smoker P:   Pulm hygiene  Empiric decadron for 24 hours given last month's episode > completed  CARDIOVASCULAR A:  Symptomatic thoracic aorta penetrating ulcer - s/p endovascular repair of descending thoracic aorta involving coverage of left subclavian artery 9/15 by Dr. Bridgett Larsson L external iliac rupture s/p repair Hx HLD, HTN, dysrhythmia Lactic acid and troponin both mildly elevated post op 9/16, suspect related to stress of surgery P:  Post op management per vascular surgery. Continue outpatient  atorvastatin Hydralazine and Labetalol PRN Hold outpatient ASA given intra-op bleeding with anemia, losartan and toprol-xl given blood loss / hypovolemia; restart when stabilized.  D/c lactate checks  RENAL A:   Hypokalemia P:   Hold IVF Monitor BMET and UOP Replace electrolytes as needed   GASTROINTESTINAL A:   GERD Nutrition P:   SUP: Pantoprazole Diet started 9/17  HEMATOLOGIC A:   Anemia - exacerbated by intra-op blood loss, retroperitoneal bleed Thrombocytopenia Coagulopathy VTE Prophylaxis P:  Transfuse for Hgb < 7 Follow CBC, move to daily  INFECTIOUS A:   Leukocytosis - improving, likely stress response post  operatively no indication of infection; P:   Monitor for fever Completed intra-op prophylactic abx   ENDOCRINE A:   Hypothyroidism following thyroidectomy Hx thyroid CA - s/p thyroidectomy P:   Continue outpatient synthroid, change to po 9/17  NEUROLOGIC A:   Acute metabolic encephalopathy > improving P:   Sedation > d/c 9/17 RASS goal: 0 to 1 Pain control   Family updated: Daughter Sharyn Lull updated 9/16 bedside by Dr Lake Bells  Interdisciplinary Family Meeting v Palliative Care Meeting:  Due by: 9/21.  PCCM will sign off. Please call if we can assist further.   Baltazar Apo, MD, PhD 08/21/2015, 8:07 AM [place] [facility] [phone] or if no answer [phone]

## 2015-08-21 NOTE — Progress Notes (Signed)
[facility] ADULT ICU REPLACEMENT PROTOCOL FOR AM LAB REPLACEMENT ONLY  The patient does apply for the [facility] Adult ICU Electrolyte Replacment Protocol based on the criteria listed below:   1. Is GFR >/= 40 ml/min? Yes.    Patient's GFR today is 55 2. Is urine output >/= 0.5 ml/kg/hr for the last 6 hours? Yes.   Patient's UOP is 0.6 ml/kg/hr 3. Is BUN < 60 mg/dL? Yes.    Patient's BUN today is 57 4. Abnormal electrolyte(s): K 3.3 5. Ordered repletion with: per protocol 6. If a panic level lab has been reported, has the CCM MD in charge been notified? No..   Physician:    Ronda Fairly A 08/21/2015 6:10 AM

## 2015-08-21 NOTE — Progress Notes (Addendum)
    Subjective  - POD #2  Says he feels much better today No flatus   Physical Exam:  Right groin incision inctact and clean Extremities warm Abdomen soft       Assessment/Plan:  POD #2  Acute blood loss anemia stable Lactate normalizing D/c aline Start clear liquid diet llikey transfer out of unit later today DC Foley  Brabham, [place] 08/21/2015 6:45 AM --  Danley Danker Vitals:   08/21/15 0500  BP: 163/108  Pulse: 107  Temp:   Resp: 22    Intake/Output Summary (Last 24 hours) at 08/21/15 0645 Last data filed at 08/21/15 0500  Gross per 24 hour  Intake 1731.42 ml  Output   3385 ml  Net -1653.58 ml     Laboratory CBC    Component Value Date/Time   WBC 16.0* 08/21/2015 0530   HGB 11.0* 08/21/2015 0530   HCT 31.1* 08/21/2015 0530   PLT 54* 08/21/2015 0530    BMET    Component Value Date/Time   NA 138 08/21/2015 0530   K 3.3* 08/21/2015 0530   CL 104 08/21/2015 0530   CO2 26 08/21/2015 0530   GLUCOSE 144* 08/21/2015 0530   BUN 13 08/21/2015 0530   CREATININE 0.95 08/21/2015 0530   CALCIUM 7.8* 08/21/2015 0530   GFRNONAA 53* 08/21/2015 0530   GFRAA >60 08/21/2015 0530    COAG Lab Results  Component Value Date   INR 1.57* 08/20/2015   INR 2.70* 08/19/2015   INR 2.65* 08/19/2015   No results found for: PTT  Antibiotics Anti-infectives    Start     Dose/Rate Route Frequency Ordered Stop   08/20/15 1400  vancomycin (VANCOCIN) 500 mg in sodium chloride 0.9 % 100 mL IVPB     500 mg 100 mL/hr over 60 Minutes Intravenous Every 24 hours 08/19/15 2048 08/20/15 1554   08/20/15 0300  vancomycin (VANCOCIN) IVPB 1000 mg/200 mL premix  Status:  Discontinued     1,000 mg 200 mL/hr over 60 Minutes Intravenous Every 12 hours 08/19/15 2013 08/19/15 2047   08/19/15 0955  vancomycin (VANCOCIN) 1 GM/200ML IVPB    Comments:  Ara Kussmaul   : cabinet override      08/19/15 0955 08/19/15 2159   08/19/15 0950  vancomycin (VANCOCIN) IVPB 1000 mg/200 mL  premix     1,000 mg 200 mL/hr over 60 Minutes Intravenous 60 min pre-op 08/19/15 0950 08/19/15 1540       V. Leia Alf, M.D. [facility] Office: [phone] Pager:  [phone]

## 2015-08-21 NOTE — Progress Notes (Signed)
CRITICAL VALUE ALERT  Critical value received:  Lactic Acid = 2.7  Date of notification:  08/20/2015  Time of notification:  2250  Critical value read back:Yes.    Nurse who received alert:  Cassell Smiles, RN  MD notified (1st page):  None - value consistent with and improved from prior result  Time of first page:    MD notified (2nd page):  Time of second page:  Responding MD:    Time MD responded:

## 2015-08-22 LAB — BASIC METABOLIC PANEL
ANION GAP: 8 (ref 5–15)
BUN: 10 mg/dL (ref 6–20)
CO2: 28 mmol/L (ref 22–32)
Calcium: 7.9 mg/dL — ABNORMAL LOW (ref 8.9–10.3)
Chloride: 101 mmol/L (ref 101–111)
Creatinine, Ser: 0.88 mg/dL (ref 0.44–1.00)
GFR calc Af Amer: >60 mL/min (ref >60)
GFR, EST NON AFRICAN AMERICAN: 59 mL/min — AB (ref >60)
Glucose, Bld: 112 mg/dL — ABNORMAL HIGH (ref 65–99)
POTASSIUM: 3.5 mmol/L (ref 3.5–5.1)
SODIUM: 137 mmol/L (ref 135–145)

## 2015-08-22 LAB — CBC
HEMATOCRIT: 31.5 % — AB (ref 36.0–46.0)
Hemoglobin: 10.9 g/dL — ABNORMAL LOW (ref 12.0–15.0)
MCH: 28.6 pg (ref 26.0–34.0)
MCHC: 34.6 g/dL (ref 30.0–36.0)
MCV: 82.7 fL (ref 78.0–100.0)
PLATELETS: 63 10*3/uL — AB (ref 150–400)
RBC: 3.81 MIL/uL — ABNORMAL LOW (ref 3.87–5.11)
RDW: 17.9 % — AB (ref 11.5–15.5)
WBC: 15.1 10*3/uL — AB (ref 4.0–10.5)

## 2015-08-22 NOTE — Progress Notes (Signed)
    Subjective  - POD #3  She feels well today Bowel movement yesterday   Physical Exam:  Abdomen tender in right lower quadrant Incision ok Well perfused extremities        Assessment/Plan:  POD #2  Lactate has normalized Transfer to floor Hb stable x48 hours Anticipate d/c tomorrow  Annamarie Major 08/22/2015 12:02 PM --  Filed Vitals:   08/22/15 1100  BP: 137/68  Pulse: 86  Temp:   Resp: 23    Intake/Output Summary (Last 24 hours) at 08/22/15 1202 Last data filed at 08/22/15 1100  Gross per 24 hour  Intake    820 ml  Output   1650 ml  Net   -830 ml     Laboratory CBC    Component Value Date/Time   WBC 15.1* 08/22/2015 0420   HGB 10.9* 08/22/2015 0420   HCT 31.5* 08/22/2015 0420   PLT 63* 08/22/2015 0420    BMET    Component Value Date/Time   NA 137 08/22/2015 0420   K 3.5 08/22/2015 0420   CL 101 08/22/2015 0420   CO2 28 08/22/2015 0420   GLUCOSE 112* 08/22/2015 0420   BUN 10 08/22/2015 0420   CREATININE 0.88 08/22/2015 0420   CALCIUM 7.9* 08/22/2015 0420   GFRNONAA 59* 08/22/2015 0420   GFRAA >60 08/22/2015 0420    COAG Lab Results  Component Value Date   INR 1.57* 08/20/2015   INR 2.70* 08/19/2015   INR 2.65* 08/19/2015   No results found for: PTT  Antibiotics Anti-infectives    Start     Dose/Rate Route Frequency Ordered Stop   08/20/15 1400  vancomycin (VANCOCIN) 500 mg in sodium chloride 0.9 % 100 mL IVPB     500 mg 100 mL/hr over 60 Minutes Intravenous Every 24 hours 08/19/15 2048 08/20/15 1554   08/20/15 0300  vancomycin (VANCOCIN) IVPB 1000 mg/200 mL premix  Status:  Discontinued     1,000 mg 200 mL/hr over 60 Minutes Intravenous Every 12 hours 08/19/15 2013 08/19/15 2047   08/19/15 0955  vancomycin (VANCOCIN) 1 GM/200ML IVPB    Comments:  Ara Kussmaul   : cabinet override      08/19/15 0955 08/19/15 2159   08/19/15 0950  vancomycin (VANCOCIN) IVPB 1000 mg/200 mL premix     1,000 mg 200 mL/hr over 60 Minutes  Intravenous 60 min pre-op 08/19/15 0950 08/19/15 1540       V. Leia Alf, M.D. [facility] Office: [phone] Pager:  [phone]

## 2015-08-23 ENCOUNTER — Inpatient Hospital Stay (HOSPITAL_COMMUNITY): Payer: Medicare (Managed Care)

## 2015-08-23 MED ORDER — FLEET ENEMA 7-19 GM/118ML RE ENEM
1.0000 | ENEMA | Freq: Once | RECTAL | Status: AC
  Administered 2015-08-23: 1 via RECTAL
  Filled 2015-08-23: qty 1

## 2015-08-23 MED ORDER — POLYETHYLENE GLYCOL 3350 17 G PO PACK
17.0000 g | PACK | Freq: Every day | ORAL | Status: DC | PRN
Start: 2015-08-23 — End: 2015-08-24
  Administered 2015-08-23: 17 g via ORAL
  Filled 2015-08-23 (×2): qty 1

## 2015-08-23 MED ORDER — BISACODYL 10 MG RE SUPP
10.0000 mg | Freq: Every day | RECTAL | Status: DC | PRN
  Administered 2015-08-23: 10 mg via RECTAL
  Filled 2015-08-23: qty 1

## 2015-08-23 NOTE — Care Management Note (Signed)
Case Management Note  Patient Details  Name: Lindsay Park MRN: [id number] Date of Birth: Nov 24, 1929  Subjective/Objective:         Talked with daughter, Sharyn Lull.  Patient does live with her, she works but states Mom is very functional, able to [employer] and do all her daily activities.  Does go to [place] twice a week for activities.  Plans to start a new job in [place] next week and will be moving and taking Mom.  Until she gets settled though, Mom will be staying here and her brother and sister in law will be moving in with her.  States that she feels Mom may benefit from a quad cane.  Will need order for this prior to discharge if would like patient to have.    Action/Plan:   Expected Discharge Date:                  Expected Discharge Plan:  Home/Self Care  In-House Referral:     Discharge planning Services  CM Consult  Post Acute Care Choice:    Choice offered to:     DME Arranged:    DME Agency:     HH Arranged:    HH Agency:     Status of Service:  In process, will continue to follow  Medicare Important Message Given:    Date Medicare IM Given:    Medicare IM give by:    Date Additional Medicare IM Given:    Additional Medicare Important Message give by:     If discussed at [place] of Stay Meetings, dates discussed:    Additional Comments:  Vergie Living, RN 08/23/2015, 2:16 PM

## 2015-08-23 NOTE — Care Management Important Message (Signed)
Important Message  Patient Details  Name: Lindsay Park MRN: [id number] Date of Birth: [birth year]   Medicare Important Message Given:  Yes-second notification given    Nathen May 08/23/2015, 2:27 PM

## 2015-08-23 NOTE — Progress Notes (Signed)
   Daily Progress Note  Assessment/Planning: POD #4 s/p TEVAR, R iliofemoral bypass for ruptured R EIA   Good PO intake, UOP adequate for her small size  Home probably tomorrow  Ok to go to floor  Subjective  - 4 Days Post-Op  Constipated, tolerating PO, coughing up some stuff  Objective Filed Vitals:   08/23/15 0400 08/23/15 0500 08/23/15 0600 08/23/15 0700  BP: 117/68 139/79 119/71 127/67  Pulse:   92   Temp: 97.7 F (36.5 C)   99.6 F (37.6 C)  TempSrc: Oral     Resp: 20 23 19 21   Height:      Weight:      SpO2: 97%  96%     Intake/Output Summary (Last 24 hours) at 08/23/15 0903 Last data filed at 08/23/15 0200  Gross per 24 hour  Intake    540 ml  Output    775 ml  Net   -235 ml    PULM  CTAB CV  RRR GI  soft, ND, minimal TTP VASC  R groin incision c/d/i, staple line intact  Laboratory CBC    Component Value Date/Time   WBC 15.1* 08/22/2015 0420   HGB 10.9* 08/22/2015 0420   HCT 31.5* 08/22/2015 0420   PLT 63* 08/22/2015 0420    BMET    Component Value Date/Time   NA 137 08/22/2015 0420   K 3.5 08/22/2015 0420   CL 101 08/22/2015 0420   CO2 28 08/22/2015 0420   GLUCOSE 112* 08/22/2015 0420   BUN 10 08/22/2015 0420   CREATININE 0.88 08/22/2015 0420   CALCIUM 7.9* 08/22/2015 0420   GFRNONAA 59* 08/22/2015 0420   GFRAA >60 08/22/2015 0420    Adele Barthel, MD [facility] [place]: [phone] Pager: [phone]  08/23/2015, 9:03 AM

## 2015-08-23 NOTE — Care Management Note (Signed)
Case Management Note  Patient Details  Name: Lindsay Park MRN: [id number] Date of Birth: 01-10-1929  Subjective/Objective:     Patient in bed writing in abd pain.  Per nurse lives at home with daughter.                 Action/Plan:   Expected Discharge Date:                  Expected Discharge Plan:  Home/Self Care  In-House Referral:     Discharge planning Services  CM Consult  Post Acute Care Choice:    Choice offered to:     DME Arranged:    DME Agency:     HH Arranged:    HH Agency:     Status of Service:  In process, will continue to follow  Medicare Important Message Given:    Date Medicare IM Given:    Medicare IM give by:    Date Additional Medicare IM Given:    Additional Medicare Important Message give by:     If discussed at [place] of Stay Meetings, dates discussed:    Additional Comments:  Vergie Living, RN 08/23/2015, 10:47 AM

## 2015-08-24 ENCOUNTER — Other Ambulatory Visit: Payer: Self-pay | Admitting: *Deleted

## 2015-08-24 DIAGNOSIS — Z48812 Encounter for surgical aftercare following surgery on the circulatory system: Secondary | ICD-10-CM

## 2015-08-24 DIAGNOSIS — I712 Thoracic aortic aneurysm, without rupture, unspecified: Secondary | ICD-10-CM

## 2015-08-24 MED ORDER — TRAMADOL HCL 50 MG PO TABS: 50.0000 mg | ORAL_TABLET | Freq: Four times a day (QID) | ORAL | Status: AC | PRN

## 2015-08-24 NOTE — Progress Notes (Addendum)
  [facility] Progress Note  Subjective  - No complaints today. Denies abdominal pain. Had bowel movement yesterday.    Objective Filed Vitals:   08/24/15 0449  BP: 100/57  Pulse: 97  Temp: 98.3 F (36.8 C)  Resp: 20    Intake/Output Summary (Last 24 hours) at 08/24/15 0758 Last data filed at 08/24/15 0450  Gross per 24 hour  Intake    420 ml  Output    303 ml  Net    117 ml    Abdomen soft and non tender. Incision intact. Feet warm and well perfused.   Assessment/Planning: 81 y.o. female is s/p: TEVAR, R iliofemoral bypass for ruptured R EIA 5 Days Post-Op   Progressing well. Tolerating po's UOP 775 yesterday. Likely higher due to unmeasured voids.  Had BM yesterday.  Probably home today. Will discuss with Dr. Bridgett Larsson.   Lindsay Park 08/24/2015 7:58 AM --  Laboratory CBC    Component Value Date/Time   WBC 15.1* 08/22/2015 0420   HGB 10.9* 08/22/2015 0420   HCT 31.5* 08/22/2015 0420   PLT 63* 08/22/2015 0420    BMET    Component Value Date/Time   NA 137 08/22/2015 0420   K 3.5 08/22/2015 0420   CL 101 08/22/2015 0420   CO2 28 08/22/2015 0420   GLUCOSE 112* 08/22/2015 0420   BUN 10 08/22/2015 0420   CREATININE 0.88 08/22/2015 0420   CALCIUM 7.9* 08/22/2015 0420   GFRNONAA 59* 08/22/2015 0420   GFRAA >60 08/22/2015 0420    COAG Lab Results  Component Value Date   INR 1.57* 08/20/2015   INR 2.70* 08/19/2015   INR 2.65* 08/19/2015   No results found for: PTT  Antibiotics Anti-infectives    Start     Dose/Rate Route Frequency Ordered Stop   08/20/15 1400  vancomycin (VANCOCIN) 500 mg in sodium chloride 0.9 % 100 mL IVPB     500 mg 100 mL/hr over 60 Minutes Intravenous Every 24 hours 08/19/15 2048 08/20/15 1554   08/20/15 0300  vancomycin (VANCOCIN) IVPB 1000 mg/200 mL premix  Status:  Discontinued     1,000 mg 200 mL/hr over 60 Minutes Intravenous Every 12 hours 08/19/15 2013 08/19/15 2047   08/19/15 0955  vancomycin  (VANCOCIN) 1 GM/200ML IVPB    Comments:  Ara Kussmaul   : cabinet override      08/19/15 0955 08/19/15 2159   08/19/15 0950  vancomycin (VANCOCIN) IVPB 1000 mg/200 mL premix     1,000 mg 200 mL/hr over 60 Minutes Intravenous 60 min pre-op 08/19/15 0950 08/19/15 [place], PA-C [facility] Office: [phone] Pager: [phone] 08/24/2015 7:58 AM   Addendum  I have independently interviewed and examined the patient, and I agree with the physician assistant's findings.  Ok to D/C home with follow up 2 weeks for staple removal.    Adele Barthel, MD [facility] [place]: [phone] Pager: [phone]  08/24/2015, 9:01 AM

## 2015-08-24 NOTE — Progress Notes (Signed)
Patient's O2 sats 92-100% on room air while sitting in the chair.

## 2015-08-25 ENCOUNTER — Telehealth: Payer: Self-pay | Admitting: Vascular Surgery

## 2015-08-25 NOTE — Telephone Encounter (Signed)
-----   Message from Mena Goes, RN sent at 08/24/2015 11:41 AM EDT ----- Regarding: Schedule   ----- Message -----    From: Alvia Grove, PA-C    Sent: 08/24/2015   8:54 AM      To: Vvs Charge Pool  S/p TEVAR, R iliofemoral bypass for ruptured R EIA 08/19/15  F/u in 2 weeks with Dr. Bridgett Larsson with CTA chest  Thanks Maudie Mercury

## 2015-08-25 NOTE — Discharge Summary (Signed)
[facility] TEVAR Discharge Summary  Lindsay Park 07-22-1929 81 y.o. female  [id number]  Admission Date: 08/19/2015  Discharge Date: 08/24/2015  Physician: Adele Barthel, MD  Admission Diagnosis: Thoracoabdominal aortic aneurysm I71.6  HPI:   This is a 81 y.o. female who is s/p L SCA to CCA transposition, [place] 1 TAAA, Sx thoracic PAU. She had a L SCA to CCA transposition to facilitate TEVAR for her SX thoracic PAU. The patient has had no stroke or TIA symptoms. She feels better with no chest pain. Her voice has also improved. The patient notes she has had multiple episodes of allergic reactions over the last year without obvious trigger.   Hospital Course:  The patient was admitted to the hospital and taken to the operating room on 08/19/2015 and underwent: 1. Bilateral common femoral artery cannulation under ultrasound guidance 2. Aortogram 3. Intravascular ultrasound 4. Endovascular repair of descending thoracic aorta, involving coverage of left subclavian artery (34 mm x 15 cm) 5. Right iliofemoral bypass 6. Radiological S&I   7. Placement of extension graft in descending thoracic aorta (40 mm x 15 cm)    Case complicated by rupture of distal right external iliac artery with intimal disruption which was then repaired with a right iliofemoral bypass. Following the case, the patient remained on the ventilator and PCCM was called for vent management.   POD 1: The patient was successfully extubated. She complained of more abdominal pain and distension that morning. A screening CTA was ordered to evaluate for ischemic bowel and intraperitoneal hematoma. This revealed significant amounts of blood in the retroperitoneum which was consistent with right external iliac artery injury. There was no evidence of active bleeding and no evidence of ischemic bowel. Her urine output was adequate. She had acute surgical blood loss anemia and was transfused appropriately. Her lactate  was elevated.  POD 2: Her acute blood loss anemia was stable and lactate normalized. She was started on a clear liquid diet. She had a bowel movement.   POD 3: The patient felt well. Her ABLA continued to be stable. She was transferred to the floor.   POD 4, 5: The patient remained well. Her UOP remained stable and abdominal pain was resolved. She was ambulating well, voiding and tolerating a diet without difficulty. She was weaned off of oxygen. She was discharged home on POD 5 in good condition.   CBC    Component Value Date/Time   WBC 15.1* 08/22/2015 0420   RBC 3.81* 08/22/2015 0420   HGB 10.9* 08/22/2015 0420   HCT 31.5* 08/22/2015 0420   PLT 63* 08/22/2015 0420   MCV 82.7 08/22/2015 0420   MCH 28.6 08/22/2015 0420   MCHC 34.6 08/22/2015 0420   RDW 17.9* 08/22/2015 0420   LYMPHSABS 0.6* 08/21/2015 0530   MONOABS 1.5* 08/21/2015 0530   EOSABS 0.0 08/21/2015 0530   BASOSABS 0.0 08/21/2015 0530    BMET    Component Value Date/Time   NA 137 08/22/2015 0420   K 3.5 08/22/2015 0420   CL 101 08/22/2015 0420   CO2 28 08/22/2015 0420   GLUCOSE 112* 08/22/2015 0420   BUN 10 08/22/2015 0420   CREATININE 0.88 08/22/2015 0420   CALCIUM 7.9* 08/22/2015 0420   GFRNONAA 59* 08/22/2015 0420   GFRAA >60 08/22/2015 0420     Discharge Instructions:   The patient is discharged to home with extensive instructions on wound care and progressive ambulation.  They are instructed not to drive or perform any heavy lifting  until returning to see the physician in his office.  Discharge Instructions    Call MD for:  redness, tenderness, or signs of infection (pain, swelling, bleeding, redness, odor or green/yellow discharge around incision site)    Complete by:  As directed      Call MD for:  severe or increased pain, loss or decreased feeling  in affected limb(s)    Complete by:  As directed      Call MD for:  temperature >100.5    Complete by:  As directed      Discharge wound care:     Complete by:  As directed   Wash wound daily with soap and water and pat dry.     Driving Restrictions    Complete by:  As directed   No driving for 2 weeks     Increase activity slowly    Complete by:  As directed   Walk with assistance use walker or cane as needed     Lifting restrictions    Complete by:  As directed   No lifting for 2 weeks     Resume previous diet    Complete by:  As directed            Discharge Diagnosis:  Thoracoabdominal aortic aneurysm I71.6  Secondary Diagnosis: Patient Active Problem List   Diagnosis Date Noted  . Aneurysm, thoracic aortic 08/19/2015  . Post-operative state   . Respiratory failure   . Thyroid activity decreased   . Altered mental status   . Thoracic aortic aneurysm without rupture 07/14/2015  . Thoracoabdominal aortic aneurysm, without rupture 06/18/2015   Past Medical History  Diagnosis Date  . Hypercholesteremia     takes Atorvastatin daily  . CHF (congestive heart failure)   . Anemia   . COPD (chronic obstructive pulmonary disease)   . Shortness of breath dyspnea     with aneurysm  . GERD (gastroesophageal reflux disease)     takes Pepcid daily  . Hypothyroidism     takes Synthroid daily  . Hypertension     takes Losartan and Metoprolol daily  . 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 Dysrhythmia   . Pneumonia 2015    treated from home, not hosp.   Marland Kitchen Hyperglycemia     treated with oral meds., no insulin, several yrs. ago & since then has been resolved.   . Degenerative disc disease, lumbar     back, knees   . Cancer     thyroid Ca- has had removal  . PONV (postoperative nausea and vomiting)     daughter remarks that her throat swelled after surgery- Aug. 2016, that it was said that they would "use a different medicine"       Medication List    TAKE these medications        aspirin 81 MG tablet  Take 81 mg by mouth daily.     atorvastatin 20 MG tablet  Commonly known as:  LIPITOR    Take 20 mg by mouth daily.     levothyroxine 100 MCG tablet  Commonly known as:  SYNTHROID, LEVOTHROID  Take 100 mcg by mouth daily before breakfast.     losartan 25 MG tablet  Commonly known as:  COZAAR  Take 25 mg by mouth daily before breakfast.     metoprolol succinate 50 MG 24 hr tablet  Commonly known as:  TOPROL-XL  Take 50 mg by mouth daily. Take with or immediately  following a meal. Takes mid morning     traMADol 50 MG tablet  Commonly known as:  ULTRAM  Take 50 mg by mouth 2 (two) times daily as needed.     traMADol 50 MG tablet  Commonly known as:  ULTRAM  Take 1 tablet (50 mg total) by mouth every 6 (six) hours as needed for moderate pain.     Vitamin D3 50000 UNITS Caps  Take 50,000 Units by mouth once a week. On Sunday.        Tramadol #30 No Refill  Disposition: Home  Patient's condition: is Good  Follow up: 1. Dr. Bridgett Larsson in 2 weeks with CTA   Virgina Jock, PA-C [facility] [phone] 08/25/2015  8:07 PM  Addendum  I have independently interviewed and examined the patient, and I agree with the physician assistant's discharge summary.  This patient has a known thoracoabdominal aneurysm with a symptomatic thoracic penetrating aortic ulcer.  She has previously undergone left subclavian to carotid transposition to facilitate TEVAR coverage of the PAU.  She had her TEVAR which was complicated by intimal damage to the right external iliac artery.  She underwent exploration and repair of the left external iliac artery with a right external iliac artery to common femoral artery bypass with Propaten.  The patient had significant R retroperitoneal and intraperitoneal bleeding from the injury.  Due to the significant resuscitation, she remainined intubated an extra day and was extubated the next day.  The rest of the patient's post-operative course was unremarkable.  The patient recovered without obvious sequalae. She will return to the office in  2 weeks for staple removal.   Adele Barthel, MD [facility] [place]: [phone] Pager: [phone]  08/26/2015, 7:22 AM    - For VQI Registry use --- Instructions: Press F2 to tab through selections.  Delete question if not applicable.   Post-op:  Time to Extubation: [ ]  In OR, [ ]  < 12 hrs, [ ]  12-24 hrs, [ ]  >=24 hrs Vasopressors Req. Post-op: No MI: No., [ ]  Troponin only, [ ]  EKG or Clinical New Arrhythmia: No CHF: No ICU Stay: 2 days Transfusion: Yes  If yes, 3 units given  Complications: Resp failure: Yes.  , [ ]  Pneumonia, [x ] Ventilator Chg in renal function: No., [ ]  Inc. Cr > 0.5, [ ]  Temp. Dialysis, [ ]  Permanent dialysis Leg ischemia: No., no Surgery needed, [ ]  Yes, Surgery needed, [ ]  Amputation Bowel ischemia: No., [ ]  Medical Rx, [ ]  Surgical Rx Wound complication: No., [ ]  Superficial separation/infection, [ ]  Return to OR Return to OR: No  Return to OR for bleeding: No Stroke: No., [ ]  Minor, [ ]  Major  Discharge medications: Statin use:  Yes If No: [ ]  For Medical reasons, [ ]  Non-compliant, [ ]  Not-indicated ASA use:  Yes  If No: [ ]  For Medical reasons, [ ]  Non-compliant, [ ]  Not-indicated Plavix use:  No If No: [ ]  For Medical reasons, [ ]  Non-compliant, [ ]  Not-indicated Beta blocker use:  Yes If No: [ ]  For Medical reasons, [ ]  Non-compliant, [ ]  Not-indicated

## 2015-08-25 NOTE — Telephone Encounter (Signed)
Spoke with Sonya at [facility] to schedule appointments, dpm

## 2015-08-25 NOTE — Anesthesia Postprocedure Evaluation (Signed)
  Anesthesia Post-op Note  Patient: Lindsay Park  Procedure(s) Performed: Procedure(s): THORACIC AORTIC ENDOVASCULAR STENT GRAFT with Right Ilio-femoral bypass graft. (N/A) INTRAVASCULAR ULTRASOUND (N/A)  Patient Location: ICU  Anesthesia Type:General  Level of Consciousness: sedated  Airway and Oxygen Therapy: Patient Spontanous Breathing  Post-op Pain: sedated  Post-op Assessment: Post-op Vital signs reviewed LLE Motor Response: Responds to commands   RLE Motor Response: Responds to commands        Post-op Vital Signs: Reviewed and stable  Last Vitals:  Filed Vitals:   08/24/15 1406  BP: 129/71  Pulse: 102  Temp:   Resp:     Complications: No apparent anesthesia complications, intubated

## 2015-09-08 ENCOUNTER — Encounter: Payer: Self-pay | Admitting: Vascular Surgery

## 2015-09-10 ENCOUNTER — Ambulatory Visit (INDEPENDENT_AMBULATORY_CARE_PROVIDER_SITE_OTHER): Payer: Medicare (Managed Care) | Admitting: Vascular Surgery

## 2015-09-10 ENCOUNTER — Encounter: Payer: Self-pay | Admitting: Vascular Surgery

## 2015-09-10 ENCOUNTER — Ambulatory Visit
Admission: RE | Admit: 2015-09-10 | Discharge: 2015-09-10 | Disposition: A | Discharge status: Home / Self Care | Payer: Medicare (Managed Care) | Source: Ambulatory Visit | Attending: Vascular Surgery | Admitting: Vascular Surgery

## 2015-09-10 VITALS — BP 116/79 | HR 99 | Temp 96.8°F | Resp 20 | Ht 60.0 in | Wt 94.4 lb

## 2015-09-10 DIAGNOSIS — I716 Thoracoabdominal aortic aneurysm, without rupture, unspecified: Secondary | ICD-10-CM

## 2015-09-10 DIAGNOSIS — I712 Thoracic aortic aneurysm, without rupture, unspecified: Secondary | ICD-10-CM

## 2015-09-10 DIAGNOSIS — Z48812 Encounter for surgical aftercare following surgery on the circulatory system: Secondary | ICD-10-CM

## 2015-09-10 MED ORDER — IOPAMIDOL (ISOVUE-370) INJECTION 76%
75.0000 mL | Freq: Once | INTRAVENOUS | Status: AC | PRN
  Administered 2015-09-10: 75 mL via INTRAVENOUS

## 2015-09-10 NOTE — Progress Notes (Signed)
    Post-operative EVAR   History of Present Illness  Lindsay Park is a 81 y.o. female who presents post-op s/p TEVAR, R iliofemoral bypass for R external iliac artery rupture (Date: 08/19/15).  Most recent CTA (Date: 09/10/2015) demonstrates: no endoleak.  The patient has had back pain.  For VQI Use Only  PRE-ADM LIVING: Home  AMB STATUS: Ambulatory  Physical Examination  Filed Vitals:   09/10/15 1330  BP: 116/79  Pulse: 99  Temp: 96.8 F (36 C)  Resp: 20    Vascular: palpable R DP  Gastrointestinal: soft, NTND, -G/R, - HSM, - masses, - CVAT B, groin inc c/d/i, healed, staples in place  Non-Invasive Vascular Imaging  CTA Chest (09/10/2015)   Status post thoracic aortic stent graft placement without evidence of endoleak. Maximal transverse diameter of the aneurysm sac is 5.9 cm. Previously, it was 5.3 cm.   Left carotid to left subclavian anastomosis after subclavian origin ligation is patent. There is irregularity of the proximal opacifying portion of the left subclavian artery but this most likely represents postoperative change, and venous collaterals.   No obvious pseudoaneurysm.   Bilateral pleural effusions and bibasilar volume loss.   6 mm left lower lobe pulmonary nodule is stable compared with July.    Retroperitoneal hemorrhage is evolving but grossly stable in size.  Based on my review of this CTA, the TEVAR is in good position with no evidence of endoleak.  The common innominate artery/L CCA trunk is widely patent with continued flow via L SCA to L CCA transposition.  I disagree with measurement on sac size, as I can't clearly see the sac to get a measurement.  R RPH remains.  Medical Decision Making  Lindsay Park is a 81 y.o. female who presents s/p TEVAR complicated with R EIA rupture which required R iliofemoral bypass.     There is no evidence of endoleak, so I doubt the sac measurement is correct.  Her thoracic PAU is essentially excluded at this  point, so her rupture risk is limited to her mesenteric segment of TAAA.  At this point, this segment does not need intervention.  At the next CTA chest, I would go ahead and rescan the mesenteric segment again.  In this patient, I would check the perfusion of the R leg with ABI in 3 months.  If patient continues to be sx with chest pain, I would probably recheck the Chest CTA in 6 months.  Thank you for allowing [place] to participate in this patient's care.  Adele Barthel, MD [facility] [place] Office: [phone] Pager: [phone]  09/10/2015, 2:08 PM

## 2015-09-10 NOTE — Addendum Note (Signed)
Addended by: Dorthula Rue L on: 09/10/2015 02:49 PM   Modules accepted: Orders

## 2015-11-05 ENCOUNTER — Telehealth: Payer: Self-pay

## 2015-11-05 NOTE — Telephone Encounter (Signed)
-----   Message from Conrad [place], MD sent at 11/04/2015  5:27 PM EST ----- Regarding: RE: Cardiologists Referral requested- [place] patient Unfortunately, not too familiar with the cardiologist there  ----- Message -----    From: Denman George, RN    Sent: 11/04/2015   3:25 PM      To: Conrad Kettering, MD Subject: Melton Alar: Cardiologists Referral requested- [place] pa#  Dr. Bridgett Larsson- can you recommend a Cardiologist in [place], [place]? I will make the referral and call the pt. ~ Arbie Cookey   ----- Message -----    From: Rufina Falco    Sent: 11/04/2015   2:13 PM      To: [facility] Clinical Pool Subject: Cardiologists Referral requested- [place] patient  Daughter of Mrs Eppie, [place] called to request a referral to a cardiologists in [place].   Dr. Bridgett Larsson indicated to Sharyn Lull that he knew physicians in the area and would be happy to make a referral.     If you have any questions, Sharyn Lull can be reached at [place]

## 2015-11-05 NOTE — Telephone Encounter (Signed)
Call placed to pt's daughter, Sharyn Lull.  Advised that Dr. Bridgett Larsson does not know any Cardiologists in the [place], [place] area.  Daughter stated she has already moved her mother to [place]  Encouraged her to ask pt's PCP for a cardiology referral.  Verb. Understanding.  Daughter stated she will keep the f/u appt. with Dr. Bridgett Larsson, scheduled for 12/17/15.

## 2015-12-10 ENCOUNTER — Encounter: Payer: Self-pay | Admitting: Vascular Surgery

## 2015-12-17 ENCOUNTER — Ambulatory Visit (HOSPITAL_COMMUNITY)
Admission: RE | Admit: 2015-12-17 | Discharge: 2015-12-17 | Disposition: A | Discharge status: Home / Self Care | Payer: Medicare (Managed Care) | Source: Ambulatory Visit | Attending: Vascular Surgery | Admitting: Vascular Surgery

## 2015-12-17 ENCOUNTER — Ambulatory Visit: Payer: Medicare (Managed Care) | Admitting: Vascular Surgery

## 2015-12-17 ENCOUNTER — Inpatient Hospital Stay (HOSPITAL_COMMUNITY)
Admission: RE | Admit: 2015-12-17 | Discharge: 2015-12-17 | Disposition: A | Discharge status: Home / Self Care | Payer: Medicare (Managed Care) | Source: Ambulatory Visit | Attending: Vascular Surgery | Admitting: Vascular Surgery

## 2015-12-17 DIAGNOSIS — I716 Thoracoabdominal aortic aneurysm, without rupture, unspecified: Secondary | ICD-10-CM

## 2015-12-17 DIAGNOSIS — I712 Thoracic aortic aneurysm, without rupture, unspecified: Secondary | ICD-10-CM

## 2017-03-13 IMAGING — US US CAROTID DUPLEX BILAT
1 series · 13 of 24 positions shown · non-contrast
Comparison: None.

CLINICAL DATA: Bilateral sudden vision loss. Hypertension,
hyperlipidemia.

EXAM:
BILATERAL CAROTID DUPLEX ULTRASOUND
TECHNIQUE: Gray scale imaging, color Doppler and duplex ultrasound was
performed of bilateral carotid and vertebral arteries in the neck.

[Series 1: us carotid duplex bilat · 0.08mm/px · 13 of 76 slices shown]
[im 1/76]
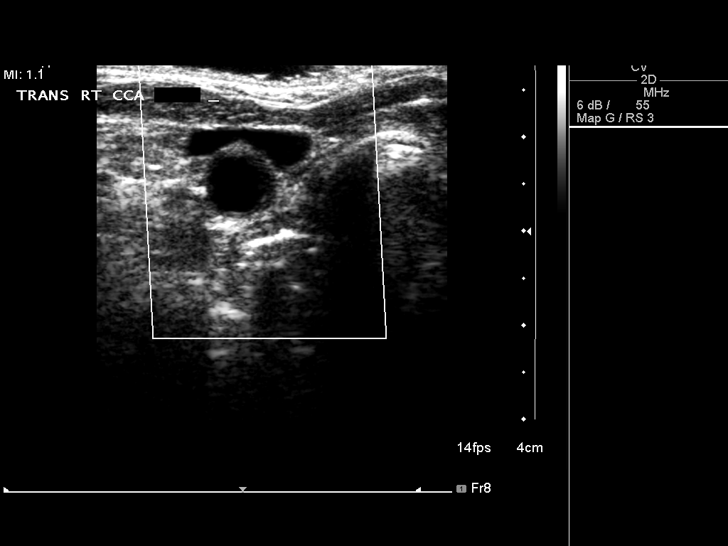
[im 7/76]
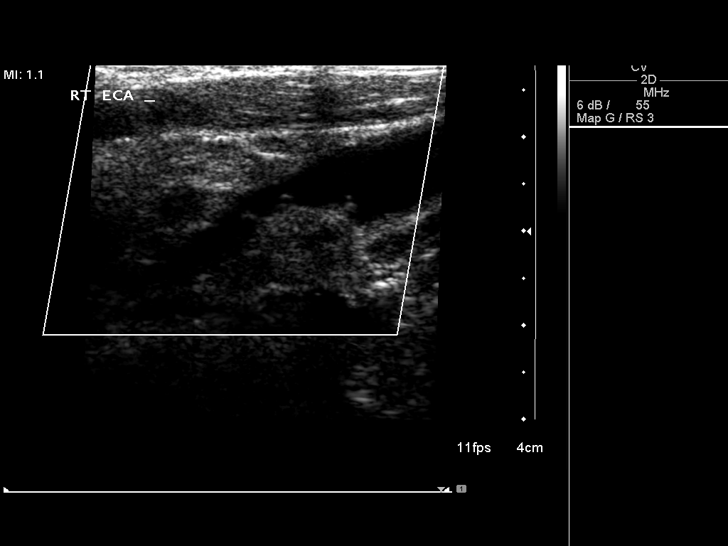
[im 14/76]
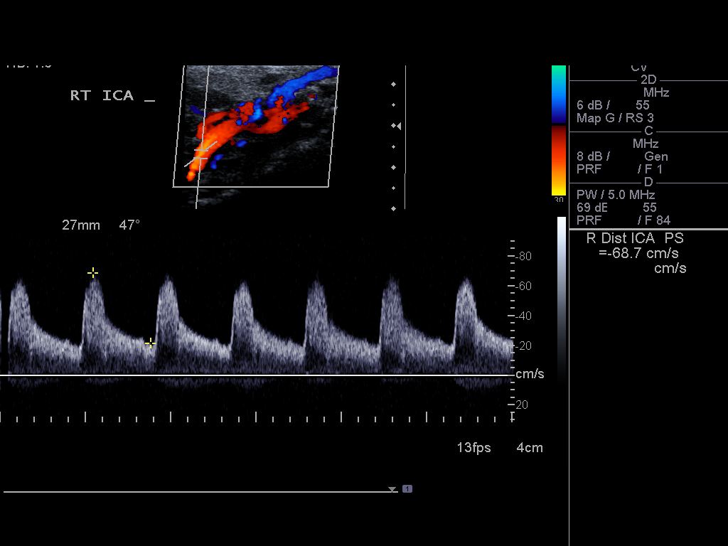
[im 20/76]
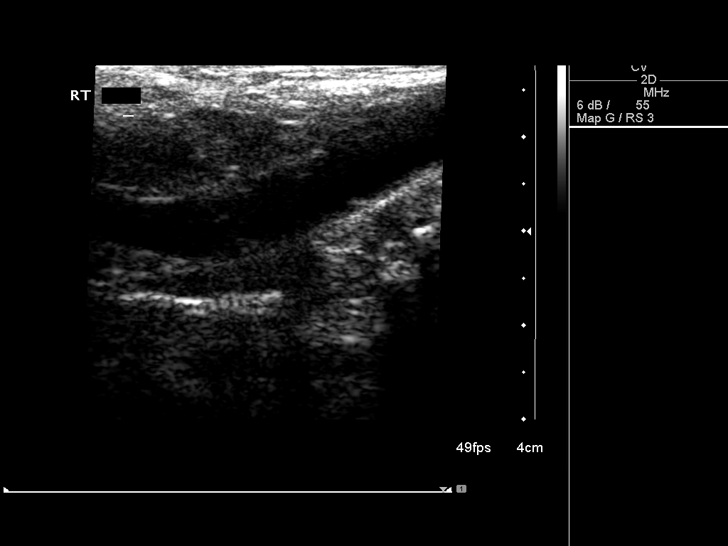
[im 27/76]
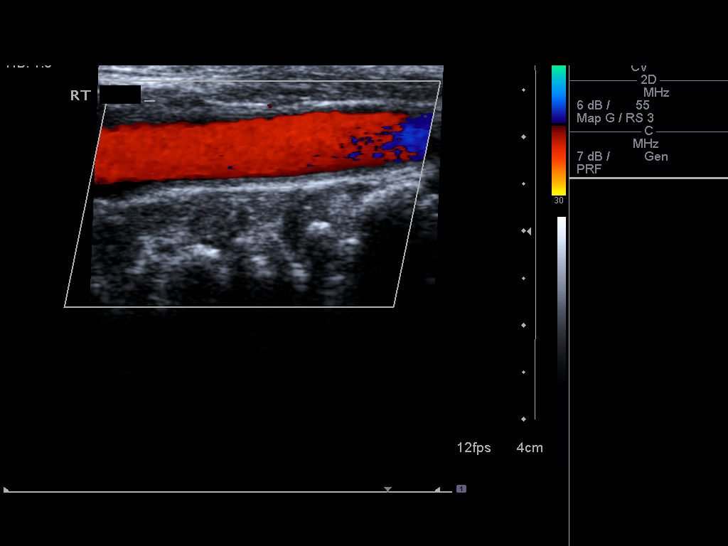
[im 33/76]
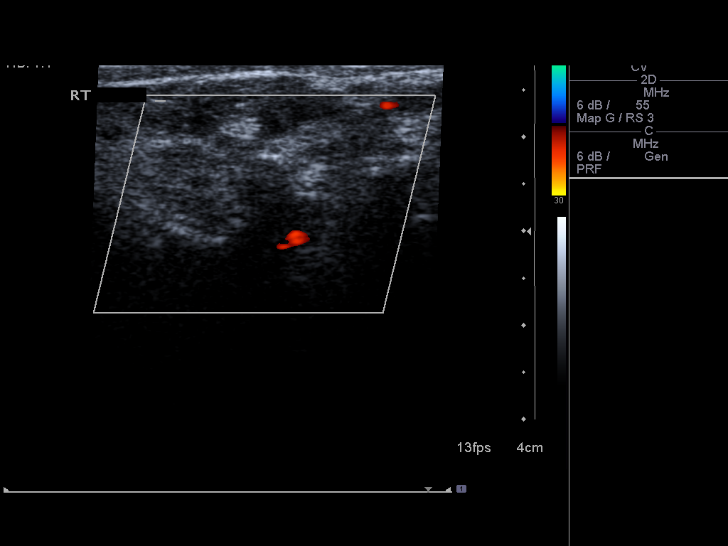
[im 40/76]
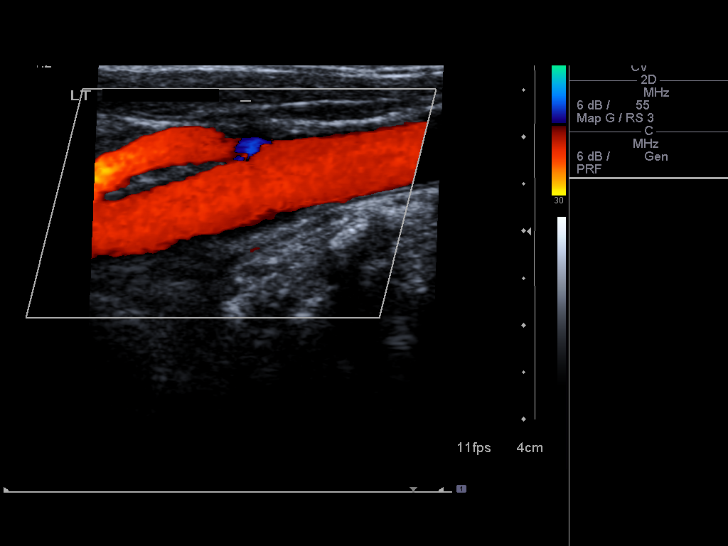
[im 43/76]
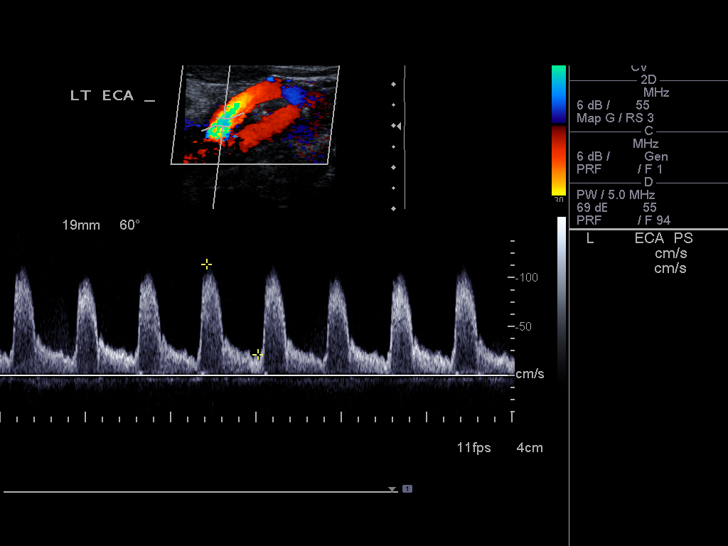
[im 49/76]
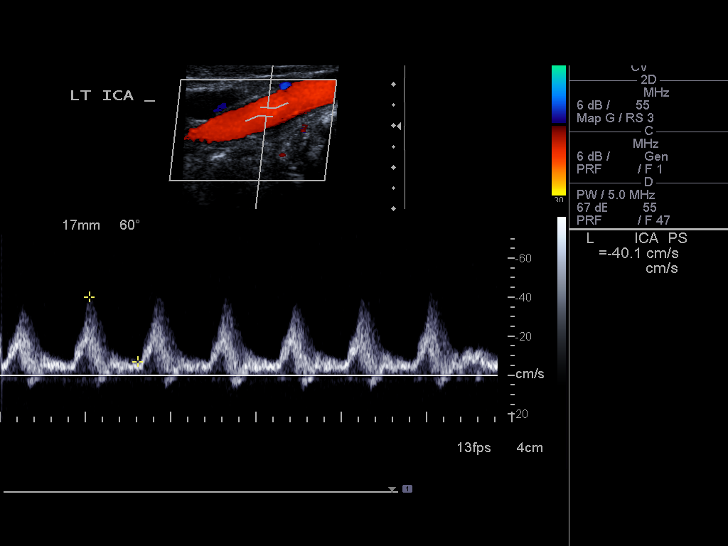
[im 56/76]
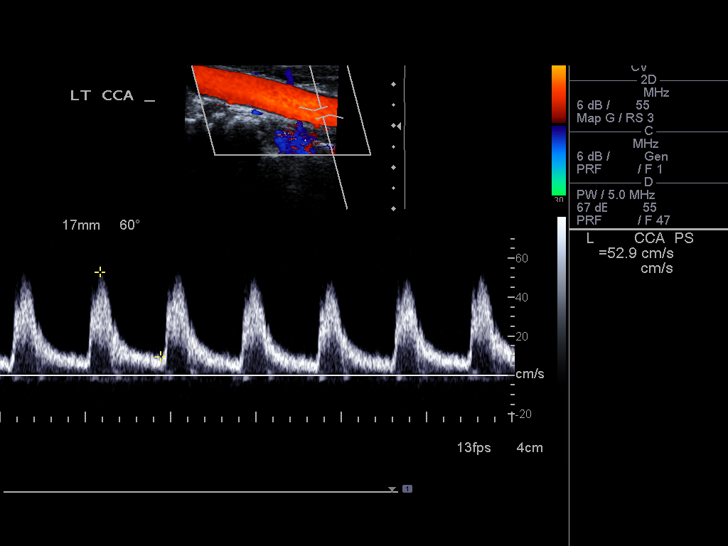
[im 62/76]
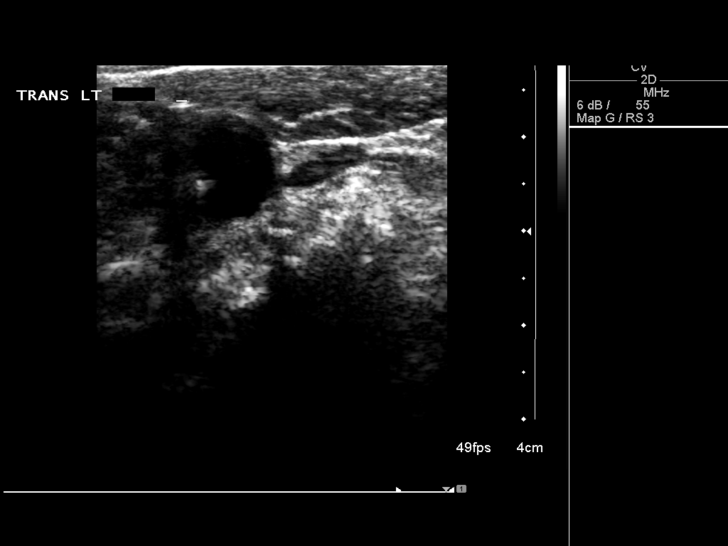
[im 69/76]
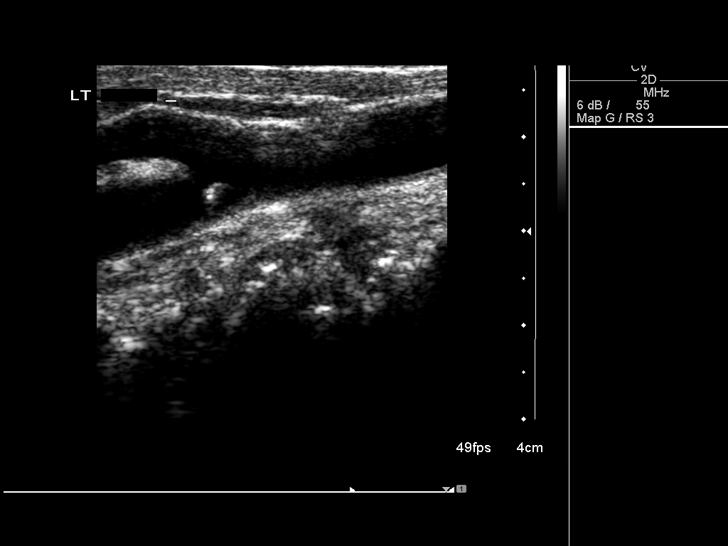
[im 76/76]
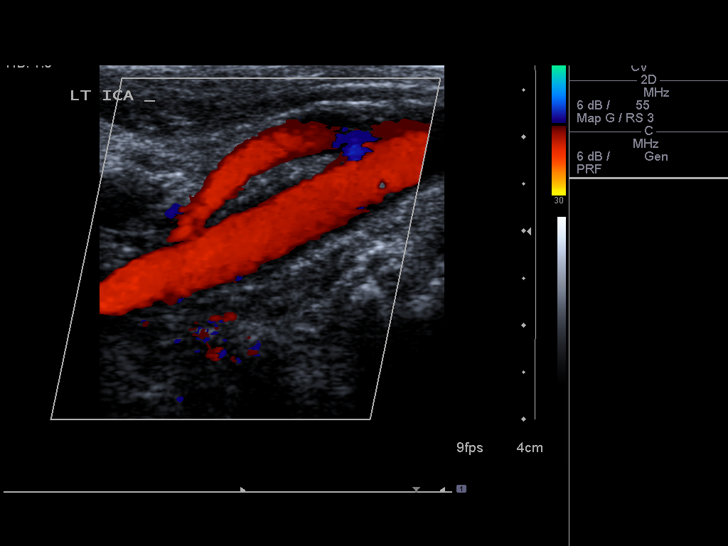

[13 of 24 positions shown; findings below may reference images not displayed]

REVIEW OF SYSTEMS:
Quantification of carotid stenosis is based on velocity parameters
that correlate the residual internal carotid diameter with
NASCET-based stenosis levels, using the diameter of the distal
internal carotid lumen as the denominator for stenosis measurement.

The following velocity measurements were obtained:

PEAK SYSTOLIC/END DIASTOLIC

RIGHT

ICA:                     69/22cm/sec

CCA:                     92/14cm/sec

SYSTOLIC ICA/CCA RATIO:

DIASTOLIC ICA/CCA RATIO:

ECA:                     75cm/sec

LEFT

ICA:                     40/7cm/sec

CCA:                     53/10cm/sec

SYSTOLIC ICA/CCA RATIO:

DIASTOLIC ICA/CCA RATIO:

ECA:                     114cm/sec
FINDINGS: RIGHT CAROTID ARTERY: Mild smooth plaque in the carotid bulb. No
high-grade stenosis. ICA mildly tortuous. Normal waveforms and color
Doppler signal.

RIGHT VERTEBRAL ARTERY:  Normal flow direction and waveform.

LEFT CAROTID ARTERY: Focal partially calcified plaque in the carotid
bulb. No high-grade stenosis. Normal waveforms and color Doppler
signal.

LEFT VERTEBRAL ARTERY: Normal flow direction and waveform.
IMPRESSION: 1. Mild bilateral carotid bifurcation plaque, with less than 50%
diameter stenosis. The exam does not exclude plaque ulceration or
embolization. Continued surveillance recommended.

## 2017-05-23 IMAGING — CR DG CHEST 1V PORT
1 series · 1 of 1 positions shown · non-contrast
Comparison: 07/14/2015.

CLINICAL DATA: Respiratory failure.

EXAM:
PORTABLE CHEST - 1 VIEW

[AP]
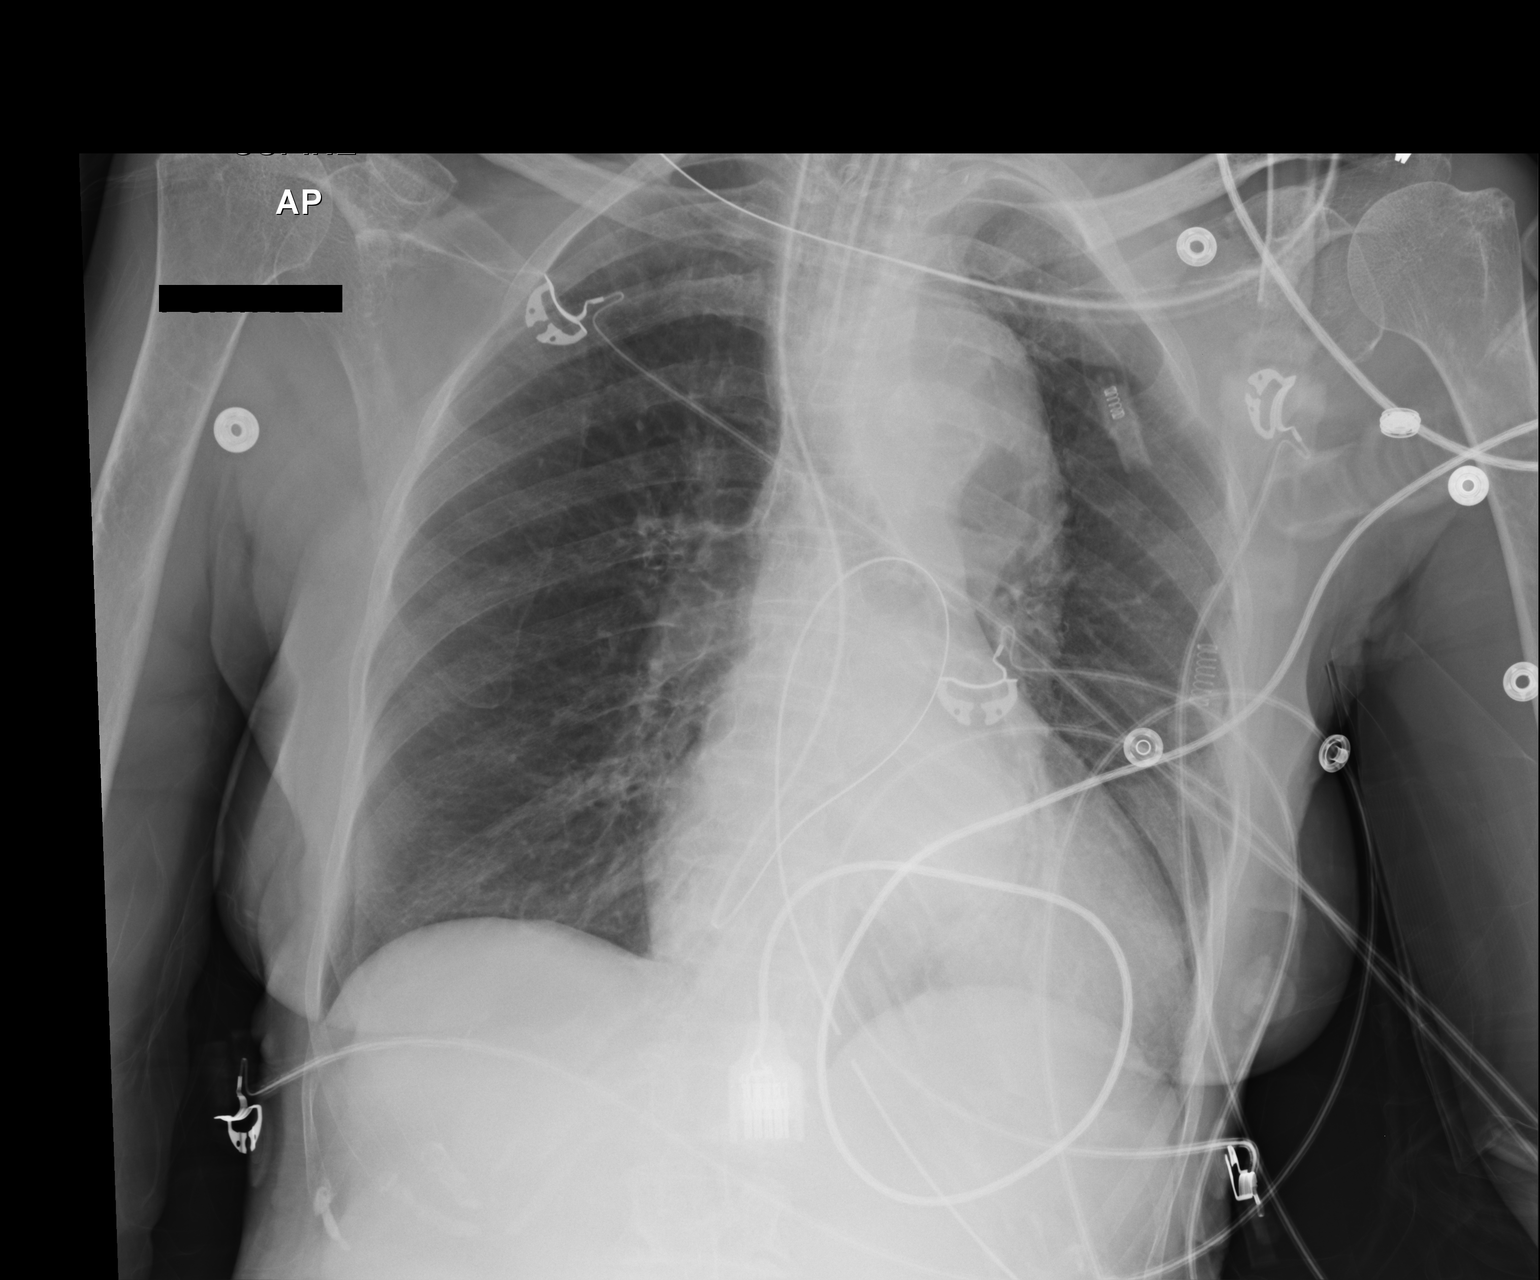

[1 of 1 positions shown; findings below may reference images not displayed]

FINDINGS: Endotracheal tube is in stable position. NG tube appears to be cold
in the esophagus within side-port the gastroesophageal junction and
tip in the stomach. Repositioning should be considered. Lungs are
clear. Cardiomegaly. No pleural effusion or pneumothorax.
IMPRESSION: 1. Endotracheal tube in good anatomic position. Interim placement of
NG tube. Midportion of the NG tube is coiled in the esophagus.
Side-port is at the gastroesophageal junction, tip is within the
stomach. Repositioning should be considered.
2. Stable cardiomegaly.  No acute pulmonary disease.

Critical Value/emergent results were called by telephone at the time
of interpretation on 07/15/2015 at [DATE] to nurse Jim, who
verbally acknowledged these results.

## 2017-07-19 IMAGING — CT CT ANGIO CHEST
3 of 9 series · 6 of 30 positions shown · IV contrast (75CC ISOVUE 370)
Comparison: CT abdomen dated 08/20/2015. CT chest pre stent graft
dated 06/10/2015.

CLINICAL DATA: Stent graft follow-up

EXAM:
CT ANGIOGRAPHY CHEST WITH CONTRAST
TECHNIQUE: Multidetector CT imaging of the chest was performed using the
standard protocol during bolus administration of intravenous
contrast. Multiplanar CT image reconstructions and MIPs were
obtained to evaluate the vascular anatomy.
CONTRAST:  75 cc Isovue 370

[Series 5: angio (id) · axial · 0.56mm/px · z∈[-158,-65]mm · 2 of 111 slices shown]
[im 37/111  lung]
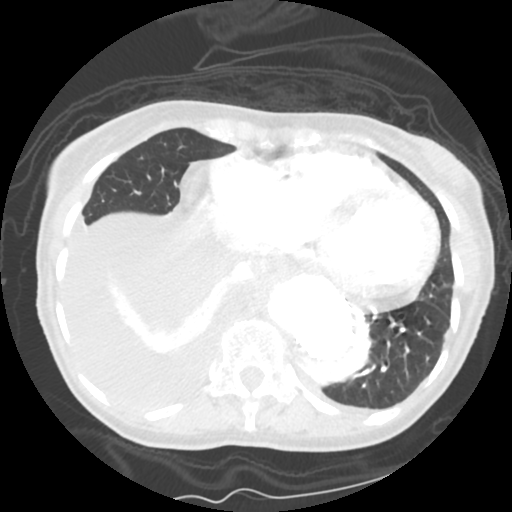
[im 74/111  mediastinal]
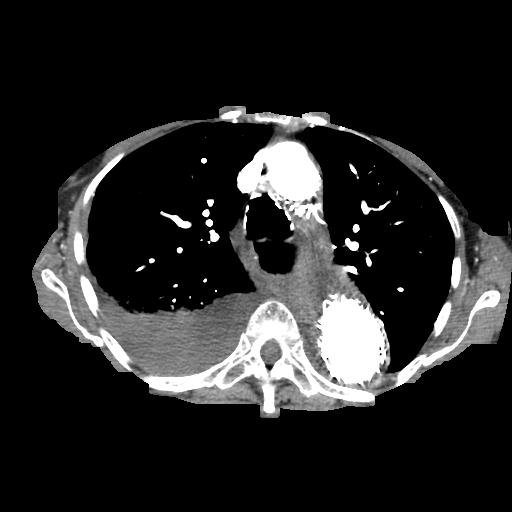

[Series 602: sagittal body · sagittal · 0.56mm/px · 2 of 115 slices shown (1 of 2)]
[im 39/115  lung]
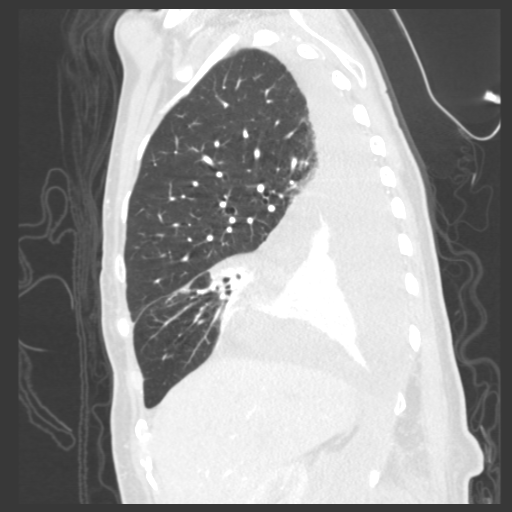
[im 77/115  lung]
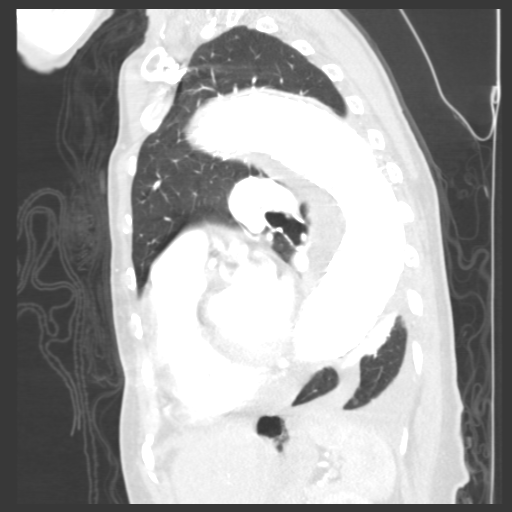

[Series 607: sagittal body · sagittal · 0.56mm/px · 2 of 115 slices shown (2 of 2)]
[im 39/115  lung]
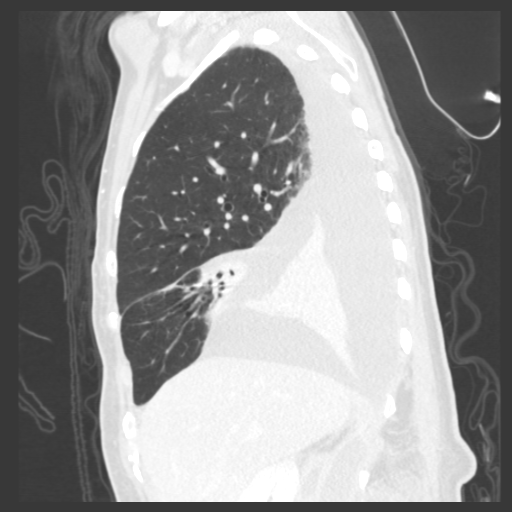
[im 77/115  lung]
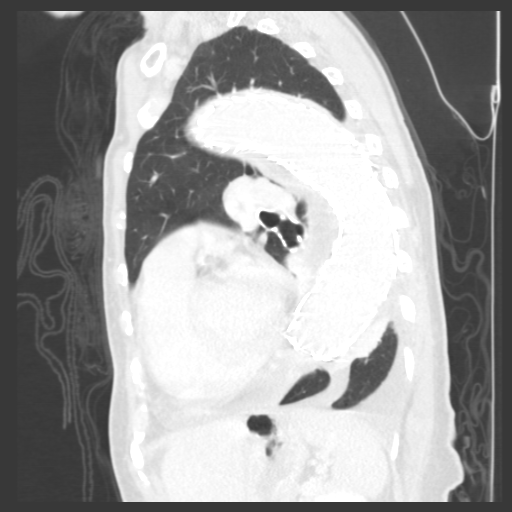

[6 of 30 positions shown; findings below may reference images not displayed]

FINDINGS: Thoracic aortic stent graft has been placed. Proximal landing zone
occurs just beyond the takeoff of the bovine great vessel origin.
The left subclavian artery origin has been covered. The proximal
left subclavian artery is occluded. Anastomosis from the left
carotid to the left subclavian artery at the thoracic inlet is
patent. Just beyond the anastomosis, in the proximal opacifying left
subclavian artery, there are 2 linear flaps which are non flow
limiting. This probably represents folding from redundancy of the
vessel. There are also some enhancing areas surrounding the proximal
left subclavian artery which is likely related to postoperative
change, ligated vessel trunks, and venous collaterals. There is no
obvious pseudoaneurysm. There after the left subclavian, axillary,
and brachial artery are widely patent. Innominate artery is patent.
Right subclavian and axillary artery are patent. Right common
carotid artery is patent. Right vertebral artery is patent. Left
vertebral artery is diminutive but grossly patent. Maximal diameter
of the aneurysm sac is 5.9 cm. Previously, this was 5.3 cm. This
occurs in the proximal descending thoracic aorta. There is no
evidence of endoleak.

Small right pericardial phrenic angle lymph node on image 82 is
stable. No pericardial effusion

Large right pleural effusion and collapse of the right lower lobe.
There is atelectasis in the right middle lobe. Small left pleural
effusion is present with volume loss in the left lower lobe.

6 mm left lower lobe pulmonary nodule on image 37 is stable in
retrospect compared with 06/10/2015.

No vertebral compression deformity.

The retroperitoneal hemorrhage posterior to the right kidney is
evolving but grossly stable in size. Both renal upper poles enhance.
There continues to be a small amount of retroperitoneal hemorrhage
adjacent to the left kidney.

Aneurysmal dilatation of the aorta at the diaphragmatic hiatus is
stable at 4.0 cm.

Review of the MIP images confirms the above findings.
IMPRESSION: Status post thoracic aortic stent graft placement without evidence
of endoleak. Maximal transverse diameter of the aneurysm sac is
cm. Previously, it was 5.3 cm.

Left carotid to left subclavian anastomosis after subclavian origin
ligation is patent. There is irregularity of the proximal opacifying
portion of the left subclavian artery but this most likely
represents postoperative change, and venous collaterals. No obvious
pseudoaneurysm.

Bilateral pleural effusions and bibasilar volume loss.

6 mm left lower lobe pulmonary nodule is stable compared with [place].
If the patient is at high risk for bronchogenic carcinoma, follow-up
chest CT at 6-12 months is recommended. If the patient is at low
risk for bronchogenic carcinoma, follow-up chest CT at 12 months is
recommended. This recommendation follows the consensus statement:
Guidelines for Management of Small Pulmonary Nodules Detected on CT
Scans: A Statement from the [HOSPITAL] as published in

Retroperitoneal hemorrhage is evolving but grossly stable in size.

## 2020-01-05 DEATH — deceased
